# Patient Record
Sex: Female | Born: 1945 | Race: White | Hispanic: No | Marital: Married | Smoking: Current every day smoker
Health system: Southern US, Community
[De-identification: ages and names within clinical notes are randomized; demographics above are authoritative.]

## PROBLEM LIST (undated history)

## (undated) DIAGNOSIS — E119 Type 2 diabetes mellitus without complications: Secondary | ICD-10-CM

## (undated) DIAGNOSIS — M1990 Unspecified osteoarthritis, unspecified site: Secondary | ICD-10-CM

## (undated) DIAGNOSIS — IMO0001 Reserved for inherently not codable concepts without codable children: Secondary | ICD-10-CM

## (undated) DIAGNOSIS — F32A Depression, unspecified: Secondary | ICD-10-CM

## (undated) DIAGNOSIS — I10 Essential (primary) hypertension: Secondary | ICD-10-CM

## (undated) DIAGNOSIS — Z950 Presence of cardiac pacemaker: Secondary | ICD-10-CM

## (undated) DIAGNOSIS — K219 Gastro-esophageal reflux disease without esophagitis: Secondary | ICD-10-CM

## (undated) DIAGNOSIS — J449 Chronic obstructive pulmonary disease, unspecified: Secondary | ICD-10-CM

## (undated) DIAGNOSIS — F329 Major depressive disorder, single episode, unspecified: Secondary | ICD-10-CM

## (undated) HISTORY — PX: CHOLECYSTECTOMY: SHX55

---

## 2002-07-28 ENCOUNTER — Ambulatory Visit (HOSPITAL_COMMUNITY): Admission: RE | Admit: 2002-07-28 | Discharge: 2002-07-28 | Payer: Self-pay | Admitting: Internal Medicine

## 2003-01-17 ENCOUNTER — Inpatient Hospital Stay (HOSPITAL_COMMUNITY): Admission: EM | Admit: 2003-01-17 | Discharge: 2003-01-21 | Payer: Self-pay | Admitting: Psychiatry

## 2003-01-19 ENCOUNTER — Encounter (HOSPITAL_COMMUNITY): Payer: Self-pay | Admitting: Psychiatry

## 2003-07-16 ENCOUNTER — Ambulatory Visit (HOSPITAL_COMMUNITY): Admission: RE | Admit: 2003-07-16 | Discharge: 2003-07-16 | Payer: Self-pay | Admitting: Orthopaedic Surgery

## 2003-07-16 ENCOUNTER — Encounter: Payer: Self-pay | Admitting: Orthopaedic Surgery

## 2005-10-22 HISTORY — PX: OTHER SURGICAL HISTORY: SHX169

## 2006-04-09 ENCOUNTER — Ambulatory Visit: Payer: Self-pay | Admitting: Cardiology

## 2006-06-04 ENCOUNTER — Ambulatory Visit (HOSPITAL_COMMUNITY): Admission: RE | Admit: 2006-06-04 | Discharge: 2006-06-04 | Payer: Self-pay | Admitting: Ophthalmology

## 2006-10-22 HISTORY — PX: EYE SURGERY: SHX253

## 2009-05-11 ENCOUNTER — Encounter: Admission: RE | Admit: 2009-05-11 | Discharge: 2009-05-11 | Payer: Self-pay | Admitting: Otolaryngology

## 2011-08-02 ENCOUNTER — Ambulatory Visit: Payer: Self-pay | Admitting: Physical Therapy

## 2011-08-09 ENCOUNTER — Ambulatory Visit: Payer: Medicare Other | Admitting: Physical Therapy

## 2014-09-17 ENCOUNTER — Emergency Department (HOSPITAL_COMMUNITY): Payer: Medicare Other

## 2014-09-17 ENCOUNTER — Encounter (HOSPITAL_COMMUNITY): Payer: Self-pay | Admitting: Emergency Medicine

## 2014-09-17 ENCOUNTER — Inpatient Hospital Stay (HOSPITAL_COMMUNITY)
Admission: EM | Admit: 2014-09-17 | Discharge: 2014-09-19 | DRG: 190 | Disposition: A | Discharge status: Home / Self Care | Payer: Medicare Other | Attending: Internal Medicine | Admitting: Internal Medicine

## 2014-09-17 DIAGNOSIS — T50995A Adverse effect of other drugs, medicaments and biological substances, initial encounter: Secondary | ICD-10-CM | POA: Diagnosis present

## 2014-09-17 DIAGNOSIS — R7989 Other specified abnormal findings of blood chemistry: Secondary | ICD-10-CM | POA: Diagnosis present

## 2014-09-17 DIAGNOSIS — E785 Hyperlipidemia, unspecified: Secondary | ICD-10-CM

## 2014-09-17 DIAGNOSIS — F419 Anxiety disorder, unspecified: Secondary | ICD-10-CM | POA: Diagnosis present

## 2014-09-17 DIAGNOSIS — E872 Acidosis: Secondary | ICD-10-CM | POA: Diagnosis present

## 2014-09-17 DIAGNOSIS — T402X1A Poisoning by other opioids, accidental (unintentional), initial encounter: Secondary | ICD-10-CM | POA: Diagnosis present

## 2014-09-17 DIAGNOSIS — I248 Other forms of acute ischemic heart disease: Secondary | ICD-10-CM | POA: Diagnosis present

## 2014-09-17 DIAGNOSIS — F17210 Nicotine dependence, cigarettes, uncomplicated: Secondary | ICD-10-CM | POA: Diagnosis present

## 2014-09-17 DIAGNOSIS — I10 Essential (primary) hypertension: Secondary | ICD-10-CM | POA: Diagnosis present

## 2014-09-17 DIAGNOSIS — T402X5A Adverse effect of other opioids, initial encounter: Secondary | ICD-10-CM | POA: Diagnosis present

## 2014-09-17 DIAGNOSIS — T40605A Adverse effect of unspecified narcotics, initial encounter: Secondary | ICD-10-CM | POA: Diagnosis present

## 2014-09-17 DIAGNOSIS — G92 Toxic encephalopathy: Secondary | ICD-10-CM | POA: Diagnosis present

## 2014-09-17 DIAGNOSIS — D72829 Elevated white blood cell count, unspecified: Secondary | ICD-10-CM | POA: Diagnosis present

## 2014-09-17 DIAGNOSIS — J9601 Acute respiratory failure with hypoxia: Secondary | ICD-10-CM | POA: Diagnosis present

## 2014-09-17 DIAGNOSIS — J441 Chronic obstructive pulmonary disease with (acute) exacerbation: Principal | ICD-10-CM | POA: Diagnosis present

## 2014-09-17 DIAGNOSIS — E1169 Type 2 diabetes mellitus with other specified complication: Secondary | ICD-10-CM | POA: Diagnosis present

## 2014-09-17 DIAGNOSIS — R0689 Other abnormalities of breathing: Secondary | ICD-10-CM | POA: Diagnosis present

## 2014-09-17 DIAGNOSIS — E1165 Type 2 diabetes mellitus with hyperglycemia: Secondary | ICD-10-CM | POA: Diagnosis present

## 2014-09-17 DIAGNOSIS — J9602 Acute respiratory failure with hypercapnia: Secondary | ICD-10-CM | POA: Diagnosis present

## 2014-09-17 DIAGNOSIS — R6510 Systemic inflammatory response syndrome (SIRS) of non-infectious origin without acute organ dysfunction: Secondary | ICD-10-CM | POA: Diagnosis present

## 2014-09-17 DIAGNOSIS — T40601A Poisoning by unspecified narcotics, accidental (unintentional), initial encounter: Secondary | ICD-10-CM | POA: Insufficient documentation

## 2014-09-17 DIAGNOSIS — R945 Abnormal results of liver function studies: Secondary | ICD-10-CM | POA: Diagnosis present

## 2014-09-17 DIAGNOSIS — Z888 Allergy status to other drugs, medicaments and biological substances status: Secondary | ICD-10-CM

## 2014-09-17 DIAGNOSIS — R062 Wheezing: Secondary | ICD-10-CM

## 2014-09-17 HISTORY — DX: Chronic obstructive pulmonary disease, unspecified: J44.9

## 2014-09-17 HISTORY — DX: Type 2 diabetes mellitus without complications: E11.9

## 2014-09-17 HISTORY — DX: Unspecified osteoarthritis, unspecified site: M19.90

## 2014-09-17 HISTORY — DX: Depression, unspecified: F32.A

## 2014-09-17 HISTORY — DX: Essential (primary) hypertension: I10

## 2014-09-17 HISTORY — DX: Presence of cardiac pacemaker: Z95.0

## 2014-09-17 HISTORY — DX: Reserved for inherently not codable concepts without codable children: IMO0001

## 2014-09-17 HISTORY — DX: Major depressive disorder, single episode, unspecified: F32.9

## 2014-09-17 HISTORY — DX: Gastro-esophageal reflux disease without esophagitis: K21.9

## 2014-09-17 LAB — URINALYSIS, ROUTINE W REFLEX MICROSCOPIC
Bilirubin Urine: NEGATIVE
KETONES UR: NEGATIVE mg/dL
Leukocytes, UA: NEGATIVE
Nitrite: NEGATIVE
PH: 5.5 (ref 5.0–8.0)
Protein, ur: NEGATIVE mg/dL
Specific Gravity, Urine: 1.018 (ref 1.005–1.030)
Urobilinogen, UA: 0.2 mg/dL (ref 0.0–1.0)

## 2014-09-17 LAB — URINE MICROSCOPIC-ADD ON

## 2014-09-17 LAB — CBC WITH DIFFERENTIAL/PLATELET
BASOS ABS: 0 10*3/uL (ref 0.0–0.1)
BASOS PCT: 0 % (ref 0–1)
Eosinophils Absolute: 0 10*3/uL (ref 0.0–0.7)
Eosinophils Relative: 0 % (ref 0–5)
HCT: 45 % (ref 36.0–46.0)
Hemoglobin: 13.8 g/dL (ref 12.0–15.0)
Lymphocytes Relative: 6 % — ABNORMAL LOW (ref 12–46)
Lymphs Abs: 1.2 10*3/uL (ref 0.7–4.0)
MCH: 29.6 pg (ref 26.0–34.0)
MCHC: 30.7 g/dL (ref 30.0–36.0)
MCV: 96.6 fL (ref 78.0–100.0)
Monocytes Absolute: 0.8 10*3/uL (ref 0.1–1.0)
Monocytes Relative: 4 % (ref 3–12)
NEUTROS ABS: 20.1 10*3/uL — AB (ref 1.7–7.7)
NEUTROS PCT: 90 % — AB (ref 43–77)
PLATELETS: 320 10*3/uL (ref 150–400)
RBC: 4.66 MIL/uL (ref 3.87–5.11)
RDW: 13.8 % (ref 11.5–15.5)
WBC: 22.1 10*3/uL — ABNORMAL HIGH (ref 4.0–10.5)

## 2014-09-17 LAB — COMPREHENSIVE METABOLIC PANEL
ALBUMIN: 4 g/dL (ref 3.5–5.2)
ALT: 47 U/L — ABNORMAL HIGH (ref 0–35)
AST: 60 U/L — AB (ref 0–37)
Alkaline Phosphatase: 148 U/L — ABNORMAL HIGH (ref 39–117)
Anion gap: 20 — ABNORMAL HIGH (ref 5–15)
BUN: 18 mg/dL (ref 6–23)
CHLORIDE: 94 meq/L — AB (ref 96–112)
CO2: 21 mEq/L (ref 19–32)
Calcium: 9.1 mg/dL (ref 8.4–10.5)
Creatinine, Ser: 1.03 mg/dL (ref 0.50–1.10)
GFR calc Af Amer: 64 mL/min — ABNORMAL LOW (ref >90)
GFR calc non Af Amer: 55 mL/min — ABNORMAL LOW (ref >90)
Glucose, Bld: 431 mg/dL — ABNORMAL HIGH (ref 70–99)
POTASSIUM: 4.9 meq/L (ref 3.7–5.3)
SODIUM: 135 meq/L — AB (ref 137–147)
Total Protein: 8 g/dL (ref 6.0–8.3)

## 2014-09-17 LAB — CBG MONITORING, ED
Glucose-Capillary: 493 mg/dL — ABNORMAL HIGH (ref 70–99)
Glucose-Capillary: 229 mg/dL — ABNORMAL HIGH (ref 70–99)

## 2014-09-17 LAB — RAPID URINE DRUG SCREEN, HOSP PERFORMED
AMPHETAMINES: NOT DETECTED
BENZODIAZEPINES: NOT DETECTED
Barbiturates: NOT DETECTED
Cocaine: NOT DETECTED
OPIATES: POSITIVE — AB
Tetrahydrocannabinol: NOT DETECTED

## 2014-09-17 LAB — BLOOD GAS, ARTERIAL
Acid-base deficit: 3.7 mmol/L — ABNORMAL HIGH (ref 0.0–2.0)
BICARBONATE: 24.3 meq/L — AB (ref 20.0–24.0)
Delivery systems: POSITIVE
Drawn by: 331471
Expiratory PAP: 5
FIO2: 0.4 %
Inspiratory PAP: 14
O2 SAT: 96.2 %
PATIENT TEMPERATURE: 98.6
TCO2: 22.3 mmol/L (ref 0–100)
pCO2 arterial: 58.5 mmHg (ref 35.0–45.0)
pH, Arterial: 7.241 — ABNORMAL LOW (ref 7.350–7.450)
pO2, Arterial: 88.3 mmHg (ref 80.0–100.0)

## 2014-09-17 LAB — GLUCOSE, CAPILLARY: GLUCOSE-CAPILLARY: 300 mg/dL — AB (ref 70–99)

## 2014-09-17 LAB — TROPONIN I

## 2014-09-17 MED ORDER — INSULIN ASPART 100 UNIT/ML ~~LOC~~ SOLN
0.0000 [IU] | Freq: Every day | SUBCUTANEOUS | Status: DC
  Administered 2014-09-17 – 2014-09-18 (×2): 3 [IU] via SUBCUTANEOUS

## 2014-09-17 MED ORDER — METHYLPREDNISOLONE SODIUM SUCC 125 MG IJ SOLR
60.0000 mg | Freq: Two times a day (BID) | INTRAMUSCULAR | Status: DC
  Administered 2014-09-17 – 2014-09-19 (×4): 60 mg via INTRAVENOUS
  Filled 2014-09-17: qty 0.96
  Filled 2014-09-17: qty 2
  Filled 2014-09-17 (×2): qty 0.96

## 2014-09-17 MED ORDER — DIPHENHYDRAMINE HCL 25 MG PO CAPS
25.0000 mg | ORAL_CAPSULE | Freq: Once | ORAL | Status: AC
  Administered 2014-09-18: 25 mg via ORAL
  Filled 2014-09-17: qty 1

## 2014-09-17 MED ORDER — PIPERACILLIN-TAZOBACTAM 3.375 G IVPB
3.3750 g | Freq: Three times a day (TID) | INTRAVENOUS | Status: DC
Start: 2014-09-17 — End: 2014-09-19
  Administered 2014-09-17 – 2014-09-19 (×5): 3.375 g via INTRAVENOUS
  Filled 2014-09-17 (×6): qty 50

## 2014-09-17 MED ORDER — INSULIN ASPART 100 UNIT/ML ~~LOC~~ SOLN
0.0000 [IU] | Freq: Three times a day (TID) | SUBCUTANEOUS | Status: DC
  Administered 2014-09-18 (×2): 15 [IU] via SUBCUTANEOUS
  Administered 2014-09-18 – 2014-09-19 (×2): 8 [IU] via SUBCUTANEOUS
  Administered 2014-09-19: 15 [IU] via SUBCUTANEOUS

## 2014-09-17 MED ORDER — IPRATROPIUM-ALBUTEROL 0.5-2.5 (3) MG/3ML IN SOLN
3.0000 mL | Freq: Once | RESPIRATORY_TRACT | Status: AC
  Administered 2014-09-17: 3 mL via RESPIRATORY_TRACT
  Filled 2014-09-17: qty 3

## 2014-09-17 MED ORDER — VANCOMYCIN HCL IN DEXTROSE 1-5 GM/200ML-% IV SOLN
1000.0000 mg | Freq: Once | INTRAVENOUS | Status: AC
  Administered 2014-09-17: 1000 mg via INTRAVENOUS
  Filled 2014-09-17: qty 200

## 2014-09-17 MED ORDER — PIPERACILLIN-TAZOBACTAM 3.375 G IVPB
3.3750 g | Freq: Three times a day (TID) | INTRAVENOUS | Status: DC
  Filled 2014-09-17 (×2): qty 50

## 2014-09-17 MED ORDER — ALBUTEROL SULFATE (2.5 MG/3ML) 0.083% IN NEBU
2.5000 mg | INHALATION_SOLUTION | RESPIRATORY_TRACT | Status: DC | PRN
  Administered 2014-09-18 – 2014-09-19 (×5): 2.5 mg via RESPIRATORY_TRACT
  Filled 2014-09-17 (×5): qty 3

## 2014-09-17 MED ORDER — INSULIN GLARGINE 100 UNIT/ML ~~LOC~~ SOLN
25.0000 [IU] | Freq: Every day | SUBCUTANEOUS | Status: DC
  Administered 2014-09-17: 25 [IU] via SUBCUTANEOUS
  Filled 2014-09-17: qty 0.25

## 2014-09-17 MED ORDER — SODIUM CHLORIDE 0.9 % IV SOLN: 250.0000 mL | INTRAVENOUS | Status: DC | PRN

## 2014-09-17 MED ORDER — INSULIN ASPART 100 UNIT/ML ~~LOC~~ SOLN
10.0000 [IU] | Freq: Once | SUBCUTANEOUS | Status: AC
  Administered 2014-09-17: 10 [IU] via INTRAVENOUS
  Filled 2014-09-17: qty 1

## 2014-09-17 MED ORDER — VANCOMYCIN HCL IN DEXTROSE 750-5 MG/150ML-% IV SOLN
750.0000 mg | Freq: Two times a day (BID) | INTRAVENOUS | Status: DC
  Administered 2014-09-18 – 2014-09-19 (×3): 750 mg via INTRAVENOUS
  Filled 2014-09-17 (×3): qty 150

## 2014-09-17 MED ORDER — METHYLPREDNISOLONE SODIUM SUCC 125 MG IJ SOLR
125.0000 mg | Freq: Once | INTRAMUSCULAR | Status: AC
  Administered 2014-09-17: 125 mg via INTRAVENOUS
  Filled 2014-09-17: qty 2

## 2014-09-17 MED ORDER — ACETAMINOPHEN 325 MG PO TABS
650.0000 mg | ORAL_TABLET | Freq: Once | ORAL | Status: AC
  Administered 2014-09-18: 650 mg via ORAL
  Filled 2014-09-17: qty 2

## 2014-09-17 MED ORDER — NICOTINE 21 MG/24HR TD PT24
21.0000 mg | MEDICATED_PATCH | Freq: Every day | TRANSDERMAL | Status: DC
  Administered 2014-09-18 – 2014-09-19 (×2): 21 mg via TRANSDERMAL
  Filled 2014-09-17 (×2): qty 1

## 2014-09-17 NOTE — Progress Notes (Addendum)
ANTIBIOTIC CONSULT NOTE - INITIAL  Pharmacy Consult for vancomycin Indication: rule out sepsis  Allergies  Allergen Reactions  . Ace Inhibitors Cough  . Nsaids     Leg edema    Patient Measurements: Height: 5\' 2"  (157.5 cm) Weight: 196 lb (88.905 kg) IBW/kg (Calculated) : 50.1 Adjusted Body Weight:   Vital Signs: Temp: 99.1 F (37.3 C) (11/27 2220) Temp Source: Oral (11/27 2220) BP: 105/40 mmHg (11/27 2220) Pulse Rate: 93 (11/27 2220) Intake/Output from previous day:   Intake/Output from this shift:    Labs:  Recent Labs  09/17/14 1441  WBC 22.1*  HGB 13.8  PLT 320  CREATININE 1.03   Estimated Creatinine Clearance: 54.9 mL/min (by C-G formula based on Cr of 1.03). No results for input(s): VANCOTROUGH, VANCOPEAK, VANCORANDOM, GENTTROUGH, GENTPEAK, GENTRANDOM, TOBRATROUGH, TOBRAPEAK, TOBRARND, AMIKACINPEAK, AMIKACINTROU, AMIKACIN in the last 72 hours.   Microbiology: No results found for this or any previous visit (from the past 720 hour(s)).  Medical History: Past Medical History  Diagnosis Date  . Diabetes mellitus without complication   . Hypertension   . Presence of permanent cardiac pacemaker   . COPD (chronic obstructive pulmonary disease)   . Shortness of breath dyspnea   . Arthritis     degenerative bone disease  . GERD (gastroesophageal reflux disease)   . Depression    Assessment: 27 YOF brought to ED due to unresponsiveness d/t accidental opioid OD. She has COPD and starting broad spectrum antibiotics for AECOPD. No pneumonia per CXR at admission. Vancomycin ordered per pharmacy and zosyn per MD.   11/27 >> Vanc >> 11/27 >> Zosyn >>   Tmax: afeb WBCs: elevated (steroids) Renal: SCr WNL for normalized CrCl = 62ml/min  11/27 blood:  Goal of Therapy:  Vancomycin trough level 15-20 mcg/ml  Plan:   Vancomycin - awaiting height and weight to dose  Check steady state level  Follow renal function  Continue zosyn 3.375gm IV q8h over  4h infusion as ordered  Doreene Eland, PharmD, BCPS.   Pager: [phone]  09/17/2014,11:08 PM   Addendum: Start Vanc with 1g x 1 then 750mg  IV q12h.  Romeo Rabon, PharmD, pager [phone]. 09/17/2014,11:08 PM.

## 2014-09-17 NOTE — Progress Notes (Signed)
Pt is awake, alert, oriented, and mentating well.  Pt is stable and comfortable.  Pt has no notable WOB or SOB.  No BiPAP needed at this time.  Rt will monitor and assess as needed.

## 2014-09-17 NOTE — ED Notes (Addendum)
Per EMS-pt from home with c/o hyperglycemia. Last seen this morning around 7. Pt was unresponsive. Given narcan. Now responding to verbal stimuli. CBG 528. Sinus rhythm. Has nasophargeal airway in place. Prescribed Percocet.
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
## 2014-09-17 NOTE — ED Notes (Signed)
Attempted to call floor for report , room assignment pending at this time. Charge Nurse aware.

## 2014-09-17 NOTE — ED Notes (Signed)
Bed: W[facility]61 Expected date:  Expected time:  Means of arrival:  Comments: 68 y/o F, unresponsive ^CBG, narcan, more awake now

## 2014-09-17 NOTE — ED Provider Notes (Signed)
CSN: [id number]     Arrival date & time 09/17/14  1314 History   First MD Initiated Contact with Patient 09/17/14 1354     Chief Complaint  Patient presents with  . Hyperglycemia     (Consider location/radiation/quality/duration/timing/severity/associated sxs/prior Treatment) Patient is a 68 y.o. female presenting with hyperglycemia. The history is provided by the EMS personnel, the patient and a relative.  Hyperglycemia She was brought into the ED because of being unresponsive. She was seen by family at about 7 AM. Patient states that she had not unable to get to sleep until after 6 AM and she had taken oxycodone-acetaminophen for pain and hydrocodone for cough. EMS arrived and found the patient unresponsive. She was treated with naloxone with significant improvement. Family states that she is much more awake but not quite to her baseline. CBC was noted to be 528. Per family, patient is a heavy smoker and is not on any oxygen at home.  Past Medical History  Diagnosis Date  . Diabetes mellitus without complication    History reviewed. No pertinent past surgical history. No family history on file. History  Substance Use Topics  . Smoking status: Not on file  . Smokeless tobacco: Not on file  . Alcohol Use: Not on file   OB History    No data available     Review of Systems  All other systems reviewed and are negative.     Allergies  Review of patient's allergies indicates not on file.  Home Medications   Prior to Admission medications   Not on File   BP 90/72 mmHg  Pulse 105  Temp(Src) 98.5 F (36.9 C) (Oral)  Resp 18  SpO2 98% Physical Exam  Nursing note and vitals reviewed.  68 year old female, resting comfortably and in no acute distress. Vital signs are significant for borderline hypertension and mild tachycardia. Oxygen saturation is 98%, which is normal. She is on a nonrebreather oxygen mask and has a nasal trumpet in place. Head is normocephalic and  atraumatic. PERRLA, EOMI. Oropharynx is clear. Neck is nontender and supple without adenopathy or JVD. Back is nontender and there is no CVA tenderness. Lungs have diffuse expiratory wheezes. Chest is nontender. Heart has regular rate and rhythm without murmur. Abdomen is soft, flat, nontender without masses or hepatosplenomegaly and peristalsis is normoactive. Extremities have 2+ edema, full range of motion is present. Skin is warm and dry without rash. Neurologic: She is sleepy but easily arousable and oriented when aroused, cranial nerves are intact, there are no motor or sensory deficits.  ED Course  Procedures (including critical care time) Labs Review Labs Reviewed  CBG MONITORING, ED - Abnormal; Notable for the following:    Glucose-Capillary 493 (*)    All other components within normal limits  CBG MONITORING, ED    Imaging Review Dg Chest Port 1 View  09/17/2014   CLINICAL DATA:  Unresponsive this morning when family tried to wake her, cough and shortness of breath for 1 day, history smoking, COPD, diabetes  EXAM: PORTABLE CHEST - 1 VIEW  COMPARISON:  Portable exam 1432 hr compared to 11/08/2009  FINDINGS: Minimal enlargement of cardiac silhouette.  Mediastinal contours and pulmonary vascularity grossly normal for technique.  Lungs clear.  No pleural effusion or pneumothorax.  Bones unremarkable.  IMPRESSION: Minimal enlargement of cardiac silhouette.  No acute abnormalities.   Electronically Signed   By: Lavonia Dana M.D.   On: 09/17/2014 14:48     EKG Interpretation  Date/Time:  Friday September 17 2014 15:32:59 EST Ventricular Rate:  96 PR Interval:  166 QRS Duration: 90 QT Interval:  396 QTC Calculation: 500 R Axis:   59 Text Interpretation:  Sinus rhythm Low voltage, precordial leads  Borderline T abnormalities, anterior leads Prolonged QT interval No old  tracing to compare Confirmed by [facility]  MD, Ercil Cassis ([id number]) on 09/17/2014  4:00:37 PM     CRITICAL  CARE Performed by: FIEPP,IRJJO Total critical care time: 95 minutes Critical care time was exclusive of separately billable procedures and treating other patients. Critical care was necessary to treat or prevent imminent or life-threatening deterioration. Critical care was time spent personally by me on the following activities: development of treatment plan with patient and/or surrogate as well as nursing, discussions with consultants, evaluation of patient's response to treatment, examination of patient, obtaining history from patient or surrogate, ordering and performing treatments and interventions, ordering and review of laboratory studies, ordering and review of radiographic studies, pulse oximetry and re-evaluation of patient's condition.  MDM   Final diagnoses:  Wheezing  Acute respiratory failure with hypoxia and hypercarbia  Narcotic overdose, accidental or unintentional, initial encounter    Altered mental status which is likely due to accidental narcotic overdose. However, she clearly has COPD as evidenced by her body habitus and wheezing. ABG will be obtained to evaluate possible hypercarbia has a component of her somnolence. Elevated CBG will be treated with insulin. She clearly is fluid overloaded now, so she will not get additional fluid. She has no recent records and the [facility] system. She is given albuterol with ipratropium for her wheezing.  ABG has come back showing evidence of acute respiratory failure with acidosis. She is awake and alert and conversant. At this point, it seems that her somnolence was probably multifactorial with both accidental narcotic overdose and hypercarbia. Hypercarbia was also worsened by being on 100% oxygen. She has considerably reduced wheezing after nebulizer treatment. She is placed on BiPAP to try to reverse her hypercarbia. She clearly will need steroids because of her COPD exacerbation with hypercarbia. Because she is already hyperglycemic  to begin with, she will definitely need hospitalization while her blood sugars are monitored closely. Family is present during discussion of all of these items.  Repeat ABG is pending. Repeat CBG is pending. She will clearly need to be admitted. Whether she goes to ICU or stepdown will be dependent on how well her hypercarbia has improved with BiPAP. Case is signed out to Dr. Wilson Singer.  Delora Fuel, MD 84/16/60 [id number]

## 2014-09-18 ENCOUNTER — Inpatient Hospital Stay (HOSPITAL_COMMUNITY): Payer: Medicare Other

## 2014-09-18 DIAGNOSIS — I517 Cardiomegaly: Secondary | ICD-10-CM

## 2014-09-18 LAB — GLUCOSE, CAPILLARY
GLUCOSE-CAPILLARY: 270 mg/dL — AB (ref 70–99)
Glucose-Capillary: 287 mg/dL — ABNORMAL HIGH (ref 70–99)
Glucose-Capillary: 352 mg/dL — ABNORMAL HIGH (ref 70–99)
Glucose-Capillary: 377 mg/dL — ABNORMAL HIGH (ref 70–99)

## 2014-09-18 LAB — COMPREHENSIVE METABOLIC PANEL
ALT: 39 U/L — AB (ref 0–35)
AST: 40 U/L — AB (ref 0–37)
Albumin: 3.7 g/dL (ref 3.5–5.2)
Alkaline Phosphatase: 117 U/L (ref 39–117)
Anion gap: 18 — ABNORMAL HIGH (ref 5–15)
BUN: 22 mg/dL (ref 6–23)
CALCIUM: 9.3 mg/dL (ref 8.4–10.5)
CO2: 23 meq/L (ref 19–32)
CREATININE: 0.92 mg/dL (ref 0.50–1.10)
Chloride: 94 mEq/L — ABNORMAL LOW (ref 96–112)
GFR calc Af Amer: 73 mL/min — ABNORMAL LOW (ref >90)
GFR, EST NON AFRICAN AMERICAN: 63 mL/min — AB (ref >90)
Glucose, Bld: 323 mg/dL — ABNORMAL HIGH (ref 70–99)
Potassium: 4.2 mEq/L (ref 3.7–5.3)
Sodium: 135 mEq/L — ABNORMAL LOW (ref 137–147)
TOTAL PROTEIN: 7.3 g/dL (ref 6.0–8.3)
Total Bilirubin: 0.2 mg/dL — ABNORMAL LOW (ref 0.3–1.2)

## 2014-09-18 LAB — CBC
HCT: 38.2 % (ref 36.0–46.0)
HEMOGLOBIN: 12.3 g/dL (ref 12.0–15.0)
MCH: 30 pg (ref 26.0–34.0)
MCHC: 32.2 g/dL (ref 30.0–36.0)
MCV: 93.2 fL (ref 78.0–100.0)
PLATELETS: 307 10*3/uL (ref 150–400)
RBC: 4.1 MIL/uL (ref 3.87–5.11)
RDW: 13.6 % (ref 11.5–15.5)
WBC: 14.7 10*3/uL — AB (ref 4.0–10.5)

## 2014-09-18 LAB — PHOSPHORUS: PHOSPHORUS: 3 mg/dL (ref 2.3–4.6)

## 2014-09-18 LAB — HEPATITIS PANEL, ACUTE
HCV Ab: NEGATIVE
HEP A IGM: NONREACTIVE
HEP B C IGM: NONREACTIVE
Hepatitis B Surface Ag: NEGATIVE

## 2014-09-18 LAB — TROPONIN I
Troponin I: 0.32 ng/mL (ref <0.30)
Troponin I: 0.37 ng/mL (ref <0.30)

## 2014-09-18 LAB — INFLUENZA PANEL BY PCR (TYPE A & B)
H1N1 flu by pcr: NOT DETECTED
Influenza A By PCR: NEGATIVE
Influenza B By PCR: NEGATIVE

## 2014-09-18 LAB — HEMOGLOBIN A1C
HEMOGLOBIN A1C: 7.9 % — AB (ref <5.7)
MEAN PLASMA GLUCOSE: 180 mg/dL — AB (ref <117)

## 2014-09-18 LAB — MAGNESIUM: Magnesium: 1.8 mg/dL (ref 1.5–2.5)

## 2014-09-18 LAB — TSH: TSH: 0.787 u[IU]/mL (ref 0.350–4.500)

## 2014-09-18 MED ORDER — CETYLPYRIDINIUM CHLORIDE 0.05 % MT LIQD
7.0000 mL | Freq: Two times a day (BID) | OROMUCOSAL | Status: DC
  Administered 2014-09-18 – 2014-09-19 (×3): 7 mL via OROMUCOSAL

## 2014-09-18 MED ORDER — BUDESONIDE-FORMOTEROL FUMARATE 80-4.5 MCG/ACT IN AERO
2.0000 | INHALATION_SPRAY | Freq: Two times a day (BID) | RESPIRATORY_TRACT | Status: DC
  Administered 2014-09-18 – 2014-09-19 (×3): 2 via RESPIRATORY_TRACT
  Filled 2014-09-18: qty 6.9

## 2014-09-18 MED ORDER — PANTOPRAZOLE SODIUM 40 MG PO TBEC
40.0000 mg | DELAYED_RELEASE_TABLET | Freq: Every day | ORAL | Status: DC
  Administered 2014-09-18 – 2014-09-19 (×2): 40 mg via ORAL
  Filled 2014-09-18 (×2): qty 1

## 2014-09-18 MED ORDER — METOPROLOL TARTRATE 25 MG PO TABS
12.5000 mg | ORAL_TABLET | Freq: Two times a day (BID) | ORAL | Status: DC
  Administered 2014-09-18 – 2014-09-19 (×3): 12.5 mg via ORAL
  Filled 2014-09-18 (×3): qty 1

## 2014-09-18 MED ORDER — OXYCODONE-ACETAMINOPHEN 5-325 MG PO TABS
1.0000 | ORAL_TABLET | Freq: Four times a day (QID) | ORAL | Status: DC | PRN
Start: 2014-09-18 — End: 2014-09-19
  Administered 2014-09-18 – 2014-09-19 (×3): 1 via ORAL
  Filled 2014-09-18 (×3): qty 1

## 2014-09-18 MED ORDER — ACETAMINOPHEN 325 MG PO TABS: 650.0000 mg | ORAL_TABLET | Freq: Four times a day (QID) | ORAL | Status: DC | PRN

## 2014-09-18 MED ORDER — ACETAMINOPHEN 650 MG RE SUPP: 650.0000 mg | Freq: Four times a day (QID) | RECTAL | Status: DC | PRN

## 2014-09-18 MED ORDER — SODIUM CHLORIDE 0.9 % IJ SOLN
3.0000 mL | Freq: Two times a day (BID) | INTRAMUSCULAR | Status: DC
  Administered 2014-09-18 (×2): 3 mL via INTRAVENOUS

## 2014-09-18 MED ORDER — ENOXAPARIN SODIUM 40 MG/0.4ML ~~LOC~~ SOLN
40.0000 mg | Freq: Every day | SUBCUTANEOUS | Status: DC
  Administered 2014-09-18 (×2): 40 mg via SUBCUTANEOUS
  Filled 2014-09-18 (×3): qty 0.4

## 2014-09-18 MED ORDER — GUAIFENESIN-DM 100-10 MG/5ML PO SYRP
5.0000 mL | ORAL_SOLUTION | ORAL | Status: DC | PRN
  Administered 2014-09-18: 5 mL via ORAL
  Filled 2014-09-18: qty 10

## 2014-09-18 MED ORDER — GUAIFENESIN ER 600 MG PO TB12
600.0000 mg | ORAL_TABLET | Freq: Two times a day (BID) | ORAL | Status: DC
Start: 2014-09-18 — End: 2014-09-18
  Administered 2014-09-18 (×2): 600 mg via ORAL
  Filled 2014-09-18 (×6): qty 1

## 2014-09-18 MED ORDER — DOCUSATE SODIUM 100 MG PO CAPS
100.0000 mg | ORAL_CAPSULE | Freq: Two times a day (BID) | ORAL | Status: DC
  Administered 2014-09-18: 100 mg via ORAL
  Filled 2014-09-18 (×4): qty 1

## 2014-09-18 MED ORDER — LORAZEPAM 0.5 MG PO TABS: 0.5000 mg | ORAL_TABLET | Freq: Four times a day (QID) | ORAL | Status: DC | PRN

## 2014-09-18 MED ORDER — IPRATROPIUM BROMIDE 0.02 % IN SOLN
0.5000 mg | Freq: Four times a day (QID) | RESPIRATORY_TRACT | Status: DC
  Administered 2014-09-18 – 2014-09-19 (×6): 0.5 mg via RESPIRATORY_TRACT
  Filled 2014-09-18 (×6): qty 2.5

## 2014-09-18 MED ORDER — INSULIN GLARGINE 100 UNIT/ML ~~LOC~~ SOLN
10.0000 [IU] | Freq: Once | SUBCUTANEOUS | Status: AC
Start: 2014-09-18 — End: 2014-09-18
  Administered 2014-09-18: 10 [IU] via SUBCUTANEOUS
  Filled 2014-09-18: qty 0.1

## 2014-09-18 MED ORDER — SODIUM CHLORIDE 0.9 % IJ SOLN: 3.0000 mL | INTRAMUSCULAR | Status: DC | PRN

## 2014-09-18 MED ORDER — FAMOTIDINE 20 MG PO TABS
20.0000 mg | ORAL_TABLET | Freq: Every day | ORAL | Status: DC
  Administered 2014-09-18 (×2): 20 mg via ORAL
  Filled 2014-09-18 (×4): qty 1

## 2014-09-18 MED ORDER — CITALOPRAM HYDROBROMIDE 20 MG PO TABS
20.0000 mg | ORAL_TABLET | Freq: Every day | ORAL | Status: DC
  Administered 2014-09-18 – 2014-09-19 (×2): 20 mg via ORAL
  Filled 2014-09-18 (×2): qty 1

## 2014-09-18 MED ORDER — ASPIRIN EC 81 MG PO TBEC
81.0000 mg | DELAYED_RELEASE_TABLET | Freq: Every day | ORAL | Status: DC
  Administered 2014-09-18 – 2014-09-19 (×2): 81 mg via ORAL
  Filled 2014-09-18 (×2): qty 1

## 2014-09-18 MED ORDER — SODIUM CHLORIDE 0.9 % IJ SOLN
3.0000 mL | Freq: Two times a day (BID) | INTRAMUSCULAR | Status: DC
  Administered 2014-09-19: 3 mL via INTRAVENOUS

## 2014-09-18 MED ORDER — INSULIN GLARGINE 100 UNIT/ML ~~LOC~~ SOLN
35.0000 [IU] | Freq: Every day | SUBCUTANEOUS | Status: DC
  Administered 2014-09-18: 35 [IU] via SUBCUTANEOUS
  Filled 2014-09-18: qty 0.35

## 2014-09-18 NOTE — Progress Notes (Signed)
  Echocardiogram 2D Echocardiogram has been performed.  Lysle Rubens 09/18/2014, 12:29 PM

## 2014-09-18 NOTE — Progress Notes (Signed)
CRITICAL VALUE ALERT  Critical value received:  Trop 0.32  Date of notification:  09/18/14  Time of notification:  0300  Critical value read back:Yes.    Nurse who received alert:  Hansel Feinstein RN  MD notified (1st page):  Kathline Magic NP  Time of first page:  0310  MD notified (2nd page):  Time of second page:  Responding MD:  Kathline Magic NP  Time MD responded:  [phone]

## 2014-09-18 NOTE — Progress Notes (Addendum)
[facility] PROGRESS NOTE  Maureen Hunt URK:[id number] DOB: 12/24/1945 DOA: 09/17/2014 PCP: Maureen Lair, MD  Assessment/Plan: Active Problems:   COPD exacerbation   Hypercarbia   DM type 2 with diabetic dyslipidemia   Opiates and related narcotics causing adverse effect   Leukocytosis   Elevated LFTs   SIRS (systemic inflammatory response syndrome)     Present on Admission:  . COPD exacerbation - - continue IV steroids, empiric, antibiotics, Albuterol PRN, scheduled Atrovent, Dulera and Mucinex. Titrate O2 to saturation >90%. Follow patients respiratory status. BiPAP prn, improve somnolent most likely will not be BiPAP overnight, no indication for repeat ABG, influenza PCR negative   Demand ischemia, abnormal troponin however chest pain-free Will start the patient on aspirin and low-dose beta blocker, 2-D echo pending  Toxic Encephalopathy -secondary to polypharmacy , patient on Celexa, gabapentin, lorazepam, Remeron, Percocet, this is improving  . Hypercarbia - patient is currently alert avoid over sedation medications . DM type 2 with diabetic dyslipidemia -increase lantus to 35 units, moderate SSI . Opiates and related narcotics causing adverse effect - hold percocet and oipid containing medications . Leukocytosis - eval for sepsis/SIRS given night sweats chills, hypotension and taachycardia, blood cultures,  . Elevated LFTs - hold statins, obtain hepatitis panel, RUQ US shows nonspecific hepatomegaly SIRS -improving, continue IVF, blood and urine culture, IV antibiotics Alcohol Use - reports no hx of abuse, this was occasional use,  Tobacco abuse not interested in quiting   Code Status: full Family Communication: family updated about patient's clinical progress Disposition Plan:  As above     Brief narrative: Maureen Hunt is a 68 y.o. female   has a past medical history of Diabetes mellitus without complication and Hypertension.   Presented with   Patient have been coughing for the past 2 weeks. Denies coughing up anything. No fever but some chills and night sweats. Patient continues to smoke 1-2 per day. Patient was given some cough syrup from her son's girlfriend that had hydrocodone in it. She was using cognac and honey to help with coughing until she run out.  Patient also takes daily Ativan for anxiety. Patient started to become somnolent. Her Son was unable to wake her up. EMS was called on arrival to ER she had an ABG 7.211/57.9/77 she was placed on BiPAP and now improved able to have a conversation on [address]. She is not on oxygen at home.   Hospitalist was called for admission for hypercarbic acidosis and somnolence  Consultants:  None  Procedures:  2-D echo  Antibiotics:  Zosyn and vancomycin  HPI/Subjective: Awake alert oriented Denies any chest pain or shortness of breath, admits to smoking 2 packs a day and smoking throughout the night  Objective: Filed Vitals:   09/18/14 0134 09/18/14 0141 09/18/14 0526 09/18/14 0834  BP:  123/57 130/66   Pulse:  94 81   Temp:  98.5 F (36.9 C) 98 F (36.7 C)   TempSrc:  Oral Oral   Resp:  20 20   Height:      Weight:      SpO2: 97% 94% 97% 95%    Intake/Output Summary (Last 24 hours) at 09/18/14 1111 Last data filed at 09/18/14 0838  Gross per 24 hour  Intake    580 ml  Output    100 ml  Net    480 ml    Exam:  General: alert & oriented x 3 In NAD  Cardiovascular: RRR, nl S1 s2  Respiratory:  Decreased breath sounds at the bases, scattered rhonchi, no crackles  Abdomen: soft +BS NT/ND, no masses palpable  Extremities: No cyanosis and no edema      Data Reviewed: Basic Metabolic Panel:  Recent Labs Lab 09/17/14 1441 09/18/14 0150  NA 135* 135*  K 4.9 4.2  CL 94* 94*  CO2 21 23  GLUCOSE 431* 323*  BUN 18 22  CREATININE 1.03 0.92  CALCIUM 9.1 9.3  MG  --  1.8  PHOS  --  3.0    Liver Function Tests:  Recent Labs Lab 09/17/14 1441  09/18/14 0150  AST 60* 40*  ALT 47* 39*  ALKPHOS 148* 117  BILITOT <0.2* 0.2*  PROT 8.0 7.3  ALBUMIN 4.0 3.7   No results for input(s): LIPASE, AMYLASE in the last 168 hours. No results for input(s): AMMONIA in the last 168 hours.  CBC:  Recent Labs Lab 09/17/14 1441 09/18/14 0150  WBC 22.1* 14.7*  NEUTROABS 20.1*  --   HGB 13.8 12.3  HCT 45.0 38.2  MCV 96.6 93.2  PLT 320 307    Cardiac Enzymes:  Recent Labs Lab 09/17/14 1952 09/18/14 0150 09/18/14 0718  TROPONINI <0.30 0.32* 0.37*   BNP (last 3 results) No results for input(s): PROBNP in the last 8760 hours.   CBG:  Recent Labs Lab 09/17/14 1324 09/17/14 1636 09/17/14 2249 09/18/14 0739  GLUCAP 493* 229* 300* 287*    No results found for this or any previous visit (from the past 240 hour(s)).   Studies: Dg Chest Port 1 View  09/17/2014   CLINICAL DATA:  Unresponsive this morning when family tried to wake her, cough and shortness of breath for 1 day, history smoking, COPD, diabetes  EXAM: PORTABLE CHEST - 1 VIEW  COMPARISON:  Portable exam 1432 hr compared to 11/08/2009  FINDINGS: Minimal enlargement of cardiac silhouette.  Mediastinal contours and pulmonary vascularity grossly normal for technique.  Lungs clear.  No pleural effusion or pneumothorax.  Bones unremarkable.  IMPRESSION: Minimal enlargement of cardiac silhouette.  No acute abnormalities.   Electronically Signed   By: Maureen Hunt M.D.   On: 09/17/2014 14:48   US Abdomen Limited Ruq  09/18/2014   CLINICAL DATA:  Initial evaluation for increased liver function enzymes  EXAM: US ABDOMEN LIMITED - RIGHT UPPER QUADRANT  COMPARISON:  05/26/2008  FINDINGS: Gallbladder:  Surgically absent  Common bile duct:  Diameter: 4 mm  Liver:  Liver shows no focal abnormalities. The liver it is however enlarged to a span of 21 cm. There is diffusely heterogeneous/echogenic hepatic echotexture.  IMPRESSION: Nonspecific findings indicating hepatomegaly and hepatic  parenchymal disease. This appearance is very similar to the prior study.   Electronically Signed   By: Maureen Hunt M.D.   On: 09/18/2014 08:34    Scheduled Meds: . antiseptic oral rinse  7 mL Mouth Rinse BID  . budesonide-formoterol  2 puff Inhalation BID  . citalopram  20 mg Oral Daily  . docusate sodium  100 mg Oral BID  . enoxaparin (LOVENOX) injection  40 mg Subcutaneous QHS  . famotidine  20 mg Oral QHS  . guaiFENesin  600 mg Oral BID  . insulin aspart  0-15 Units Subcutaneous TID WC  . insulin aspart  0-5 Units Subcutaneous QHS  . insulin glargine  25 Units Subcutaneous Q2200  . ipratropium  0.5 mg Nebulization Q6H  . methylPREDNISolone (SOLU-MEDROL) injection  60 mg Intravenous Q12H  . nicotine  21 mg Transdermal Daily  . pantoprazole  40 mg Oral Daily  . piperacillin-tazobactam (ZOSYN)  IV  3.375 g Intravenous 3 times per day  . sodium chloride  3 mL Intravenous Q12H  . sodium chloride  3 mL Intravenous Q12H  . vancomycin  750 mg Intravenous Q12H   Continuous Infusions:   Active Problems:   COPD exacerbation   Hypercarbia   DM type 2 with diabetic dyslipidemia   Opiates and related narcotics causing adverse effect   Leukocytosis   Elevated LFTs   SIRS (systemic inflammatory response syndrome)    Time spent: 40 minutes   [facility] Pager [phone]. If 7PM-7AM, please contact night-coverage at [email or web address], password [facility] 09/18/2014, 11:11 AM  LOS: 1 day

## 2014-09-18 NOTE — Progress Notes (Signed)
RN was notified that patient is coughing. PCP on call was notified.

## 2014-09-18 NOTE — Plan of Care (Signed)
Problem: Phase I Progression Outcomes Goal: Voiding-avoid urinary catheter unless indicated Outcome: Completed/Met Date Met:  09/18/14     

## 2014-09-19 LAB — CBC
HEMATOCRIT: 35 % — AB (ref 36.0–46.0)
Hemoglobin: 11.5 g/dL — ABNORMAL LOW (ref 12.0–15.0)
MCH: 30.1 pg (ref 26.0–34.0)
MCHC: 32.9 g/dL (ref 30.0–36.0)
MCV: 91.6 fL (ref 78.0–100.0)
PLATELETS: 304 10*3/uL (ref 150–400)
RBC: 3.82 MIL/uL — ABNORMAL LOW (ref 3.87–5.11)
RDW: 13.9 % (ref 11.5–15.5)
WBC: 15.1 10*3/uL — AB (ref 4.0–10.5)

## 2014-09-19 LAB — COMPREHENSIVE METABOLIC PANEL
ALBUMIN: 3.6 g/dL (ref 3.5–5.2)
ALK PHOS: 102 U/L (ref 39–117)
ALT: 36 U/L — ABNORMAL HIGH (ref 0–35)
AST: 28 U/L (ref 0–37)
Anion gap: 15 (ref 5–15)
BILIRUBIN TOTAL: 0.4 mg/dL (ref 0.3–1.2)
BUN: 22 mg/dL (ref 6–23)
CHLORIDE: 101 meq/L (ref 96–112)
CO2: 24 meq/L (ref 19–32)
Calcium: 9.5 mg/dL (ref 8.4–10.5)
Creatinine, Ser: 0.76 mg/dL (ref 0.50–1.10)
GFR calc Af Amer: >90 mL/min (ref >90)
GFR calc non Af Amer: 85 mL/min — ABNORMAL LOW (ref >90)
Glucose, Bld: 281 mg/dL — ABNORMAL HIGH (ref 70–99)
Potassium: 4 mEq/L (ref 3.7–5.3)
Sodium: 140 mEq/L (ref 137–147)
Total Protein: 6.9 g/dL (ref 6.0–8.3)

## 2014-09-19 LAB — GLUCOSE, CAPILLARY
Glucose-Capillary: 276 mg/dL — ABNORMAL HIGH (ref 70–99)
Glucose-Capillary: 368 mg/dL — ABNORMAL HIGH (ref 70–99)

## 2014-09-19 LAB — TROPONIN I: Troponin I: <0.3 ng/mL (ref <0.30)

## 2014-09-19 MED ORDER — NICOTINE 21 MG/24HR TD PT24: 21.0000 mg | MEDICATED_PATCH | Freq: Every day | TRANSDERMAL | Status: AC

## 2014-09-19 MED ORDER — INSULIN GLARGINE 100 UNIT/ML ~~LOC~~ SOLN: 35.0000 [IU] | Freq: Every day | SUBCUTANEOUS | Status: AC

## 2014-09-19 MED ORDER — GUAIFENESIN-DM 100-10 MG/5ML PO SYRP: 5.0000 mL | ORAL_SOLUTION | ORAL | Status: DC | PRN

## 2014-09-19 MED ORDER — PREDNISONE 10 MG PO TABS: ORAL_TABLET | ORAL | Status: DC

## 2014-09-19 MED ORDER — ASPIRIN 81 MG PO TBEC: 81.0000 mg | DELAYED_RELEASE_TABLET | Freq: Every day | ORAL | Status: AC

## 2014-09-19 MED ORDER — LEVOFLOXACIN 500 MG PO TABS: 500.0000 mg | ORAL_TABLET | Freq: Every day | ORAL | Status: DC

## 2014-09-19 MED ORDER — LEVALBUTEROL TARTRATE 45 MCG/ACT IN AERO: 2.0000 | INHALATION_SPRAY | Freq: Four times a day (QID) | RESPIRATORY_TRACT | Status: AC | PRN

## 2014-09-19 MED ORDER — METOPROLOL SUCCINATE ER 25 MG PO TB24: 25.0000 mg | ORAL_TABLET | Freq: Every day | ORAL | Status: AC

## 2014-09-19 MED ORDER — LORAZEPAM 1 MG PO TABS: 0.5000 mg | ORAL_TABLET | Freq: Three times a day (TID) | ORAL | Status: AC | PRN

## 2014-09-19 NOTE — Plan of Care (Signed)
Problem: Phase I Progression Outcomes Goal: Hemodynamically stable Outcome: Completed/Met Date Met:  09/19/14

## 2014-09-19 NOTE — Discharge Summary (Addendum)
Physician Discharge Summary  Maureen Hunt MRN: [id number] DOB/AGE: Dec 22, 1945 68 y.o.  PCP: Deloria Lair, MD   Admit date: 09/17/2014 Discharge date: 09/19/2014  Discharge Diagnoses:  :   COPD exacerbation   Hypercarbia   DM type 2 with diabetic dyslipidemia   Opiates and related narcotics causing adverse effect   Leukocytosis   Elevated LFTs   SIRS (systemic inflammatory response syndrome) Demand ischemia    Follow-up recommendations Follow-up with PCP in 5-7 days PCP to arrange for outpatient stress test and cardiology evaluation    Medication List    STOP taking these medications        LANTUS SOLOSTAR 100 UNIT/ML Solostar Pen  Generic drug:  Insulin Glargine  Replaced by:  insulin glargine 100 UNIT/ML injection     oxyCODONE-acetaminophen 5-325 MG per tablet  Commonly known as:  PERCOCET/ROXICET      TAKE these medications        aspirin 81 MG EC tablet  Take 1 tablet (81 mg total) by mouth daily.     budesonide-formoterol 80-4.5 MCG/ACT inhaler  Commonly known as:  SYMBICORT  Inhale 2 puffs into the lungs 2 (two) times daily.     citalopram 20 MG tablet  Commonly known as:  CELEXA  Take 20 mg by mouth daily.     gabapentin 300 MG capsule  Commonly known as:  NEURONTIN  Take 600-900 mg by mouth 3 (three) times daily. 600 IN THE AM AND AFTERNOON AND 900 MG AT BEDTIME     guaiFENesin-dextromethorphan 100-10 MG/5ML syrup  Commonly known as:  ROBITUSSIN DM  Take 5 mLs by mouth every 4 (four) hours as needed for cough.     insulin glargine 100 UNIT/ML injection  Commonly known as:  LANTUS  Inject 0.35 mLs (35 Units total) into the skin daily at 10 pm.     levalbuterol 45 MCG/ACT inhaler  Commonly known as:  XOPENEX HFA  Inhale 2 puffs into the lungs every 6 (six) hours as needed for wheezing.     levocetirizine 5 MG tablet  Commonly known as:  XYZAL  Take 5 mg by mouth every evening.     levofloxacin 500 MG tablet  Commonly known as:   LEVAQUIN  Take 1 tablet (500 mg total) by mouth daily.     LORazepam 1 MG tablet  Commonly known as:  ATIVAN  Take 0.5 tablets (0.5 mg total) by mouth every 8 (eight) hours as needed for anxiety or sleep. Anxiety or sleep     mirtazapine 30 MG tablet  Commonly known as:  REMERON  Take 30 mg by mouth at bedtime.     nicotine 21 mg/24hr patch  Commonly known as:  NICODERM CQ - dosed in mg/24 hours  Place 1 patch (21 mg total) onto the skin daily.     omeprazole 20 MG capsule  Commonly known as:  PRILOSEC  Take 20 mg by mouth 2 (two) times daily.     predniSONE 10 MG tablet  Commonly known as:  DELTASONE  - 5 tablets for 4 days  - 4 tablets for 4 days  - 3 tablets for 4 days  - 2 tablets 4 days  - 1 tablet for 4 days     ranitidine 150 MG tablet  Commonly known as:  ZANTAC  Take 300 mg by mouth daily after supper.     simvastatin 40 MG tablet  Commonly known as:  ZOCOR  Take 40 mg by mouth at  bedtime.     triamterene-hydrochlorothiazide 37.5-25 MG per tablet  Commonly known as:  MAXZIDE-25  Take 1 tablet by mouth daily.        Discharge Condition: Stable   Disposition:    Consults: None Significant Diagnostic Studies: Dg Chest Port 1 View  09/17/2014   CLINICAL DATA:  Unresponsive this morning when family tried to wake her, cough and shortness of breath for 1 day, history smoking, COPD, diabetes  EXAM: PORTABLE CHEST - 1 VIEW  COMPARISON:  Portable exam 1432 hr compared to 11/08/2009  FINDINGS: Minimal enlargement of cardiac silhouette.  Mediastinal contours and pulmonary vascularity grossly normal for technique.  Lungs clear.  No pleural effusion or pneumothorax.  Bones unremarkable.  IMPRESSION: Minimal enlargement of cardiac silhouette.  No acute abnormalities.   Electronically Signed   By: Lavonia Dana M.D.   On: 09/17/2014 14:48   US Abdomen Limited Ruq  09/18/2014   CLINICAL DATA:  Initial evaluation for increased liver function enzymes  EXAM: US ABDOMEN  LIMITED - RIGHT UPPER QUADRANT  COMPARISON:  05/26/2008  FINDINGS: Gallbladder:  Surgically absent  Common bile duct:  Diameter: 4 mm  Liver:  Liver shows no focal abnormalities. The liver it is however enlarged to a span of 21 cm. There is diffusely heterogeneous/echogenic hepatic echotexture.  IMPRESSION: Nonspecific findings indicating hepatomegaly and hepatic parenchymal disease. This appearance is very similar to the prior study.   Electronically Signed   By: Skipper Cliche M.D.   On: 09/18/2014 08:34    2-D echo Left ventricle: The cavity size was normal. Wall thickness was normal. Systolic function was normal. The estimated ejection fraction was in the range of 60% to 65%. Wall motion was normal; there were no regional wall motion abnormalities. Doppler parameters are consistent with abnormal left ventricular relaxation (grade 1 diastolic dysfunction).   Microbiology: Recent Results (from the past 240 hour(s))  Culture, blood (routine x 2)     Status: None (Preliminary result)   Collection Time: 09/17/14  7:41 PM  Result Value Ref Range Status   Specimen Description BLOOD LEFT ANTECUBITAL  Final   Special Requests BOTTLES DRAWN AEROBIC AND ANAEROBIC 4 ML  Final   Culture  Setup Time   Final    09/17/2014 22:47 Performed at [facility]    Culture   Final           BLOOD CULTURE RECEIVED NO GROWTH TO DATE CULTURE WILL BE HELD FOR 5 DAYS BEFORE ISSUING A FINAL NEGATIVE REPORT Performed at [facility]    Report Status PENDING  Incomplete  Culture, blood (routine x 2)     Status: None (Preliminary result)   Collection Time: 09/17/14  7:48 PM  Result Value Ref Range Status   Specimen Description BLOOD LEFT HAND  Final   Special Requests BOTTLES DRAWN AEROBIC AND ANAEROBIC 4 ML  Final   Culture  Setup Time   Final    09/17/2014 22:48 Performed at [facility]    Culture   Final           BLOOD CULTURE RECEIVED NO GROWTH TO DATE CULTURE WILL  BE HELD FOR 5 DAYS BEFORE ISSUING A FINAL NEGATIVE REPORT Performed at [facility]    Report Status PENDING  Incomplete     Labs: Results for orders placed or performed during the hospital encounter of 09/17/14 (from the past 48 hour(s))  CBG monitoring, ED     Status: Abnormal   Collection Time:  09/17/14  1:24 PM  Result Value Ref Range   Glucose-Capillary 493 (H) 70 - 99 mg/dL  Urinalysis, Routine w reflex microscopic     Status: Abnormal   Collection Time: 09/17/14  2:25 PM  Result Value Ref Range   Color, Urine YELLOW YELLOW   APPearance CLEAR CLEAR   Specific Gravity, Urine 1.018 1.005 - 1.030   pH 5.5 5.0 - 8.0   Glucose, UA >1000 (A) NEGATIVE mg/dL   Hgb urine dipstick SMALL (A) NEGATIVE   Bilirubin Urine NEGATIVE NEGATIVE   Ketones, ur NEGATIVE NEGATIVE mg/dL   Protein, ur NEGATIVE NEGATIVE mg/dL   Urobilinogen, UA 0.2 0.0 - 1.0 mg/dL   Nitrite NEGATIVE NEGATIVE   Leukocytes, UA NEGATIVE NEGATIVE  Urine rapid drug screen (hosp performed)     Status: Abnormal   Collection Time: 09/17/14  2:25 PM  Result Value Ref Range   Opiates POSITIVE (A) NONE DETECTED   Cocaine NONE DETECTED NONE DETECTED   Benzodiazepines NONE DETECTED NONE DETECTED   Amphetamines NONE DETECTED NONE DETECTED   Tetrahydrocannabinol NONE DETECTED NONE DETECTED   Barbiturates NONE DETECTED NONE DETECTED    Comment:        DRUG SCREEN FOR MEDICAL PURPOSES ONLY.  IF CONFIRMATION IS NEEDED FOR ANY PURPOSE, NOTIFY LAB WITHIN 5 DAYS.        LOWEST DETECTABLE LIMITS FOR URINE DRUG SCREEN Drug Class       Cutoff (ng/mL) Amphetamine      1000 Barbiturate      200 Benzodiazepine   491 Tricyclics       791 Opiates          300 Cocaine          300 THC              50   Urine microscopic-add on     Status: Abnormal   Collection Time: 09/17/14  2:25 PM  Result Value Ref Range   Squamous Epithelial / LPF FEW (A) RARE   WBC, UA 3-6 <3 WBC/hpf   RBC / HPF 0-2 <3 RBC/hpf   Bacteria, UA  RARE RARE   Urine-Other AMORPHOUS URATES/PHOSPHATES   CBC with Differential     Status: Abnormal   Collection Time: 09/17/14  2:41 PM  Result Value Ref Range   WBC 22.1 (H) 4.0 - 10.5 K/uL   RBC 4.66 3.87 - 5.11 MIL/uL   Hemoglobin 13.8 12.0 - 15.0 g/dL   HCT 45.0 36.0 - 46.0 %   MCV 96.6 78.0 - 100.0 fL   MCH 29.6 26.0 - 34.0 pg   MCHC 30.7 30.0 - 36.0 g/dL   RDW 13.8 11.5 - 15.5 %   Platelets 320 150 - 400 K/uL   Neutrophils Relative % 90 (H) 43 - 77 %   Neutro Abs 20.1 (H) 1.7 - 7.7 K/uL   Lymphocytes Relative 6 (L) 12 - 46 %   Lymphs Abs 1.2 0.7 - 4.0 K/uL   Monocytes Relative 4 3 - 12 %   Monocytes Absolute 0.8 0.1 - 1.0 K/uL   Eosinophils Relative 0 0 - 5 %   Eosinophils Absolute 0.0 0.0 - 0.7 K/uL   Basophils Relative 0 0 - 1 %   Basophils Absolute 0.0 0.0 - 0.1 K/uL  Comprehensive metabolic panel     Status: Abnormal   Collection Time: 09/17/14  2:41 PM  Result Value Ref Range   Sodium 135 (L) 137 - 147 mEq/L   Potassium 4.9 3.7 -  5.3 mEq/L   Chloride 94 (L) 96 - 112 mEq/L   CO2 21 19 - 32 mEq/L   Glucose, Bld 431 (H) 70 - 99 mg/dL   BUN 18 6 - 23 mg/dL   Creatinine, Ser 1.03 0.50 - 1.10 mg/dL   Calcium 9.1 8.4 - 10.5 mg/dL   Total Protein 8.0 6.0 - 8.3 g/dL   Albumin 4.0 3.5 - 5.2 g/dL   AST 60 (H) 0 - 37 U/L    Comment: SLIGHT HEMOLYSIS HEMOLYSIS AT THIS LEVEL MAY AFFECT RESULT    ALT 47 (H) 0 - 35 U/L   Alkaline Phosphatase 148 (H) 39 - 117 U/L   Total Bilirubin <0.2 (L) 0.3 - 1.2 mg/dL   GFR calc non Af Amer 55 (L) >90 mL/min   GFR calc Af Amer 64 (L) >90 mL/min    Comment: (NOTE) The eGFR has been calculated using the CKD EPI equation. This calculation has not been validated in all clinical situations. eGFR's persistently <90 mL/min signify possible Chronic Kidney Disease.    Anion gap 20 (H) 5 - 15  Blood gas, arterial     Status: Abnormal   Collection Time: 09/17/14  2:50 PM  Result Value Ref Range   O2 Content 3.0 L/min   pH, Arterial 7.211 (L)  7.350 - 7.450   pCO2 arterial 57.9 (HH) 35.0 - 45.0 mmHg    Comment: CRITICAL RESULT CALLED TO, READ BACK BY AND VERIFIED WITH: DR Davy Pique AT [id number] BY LISA CRADDOCK,RRT,RCP ON 09/17/14    pO2, Arterial 77.0 (L) 80.0 - 100.0 mmHg   Bicarbonate 22.3 20.0 - 24.0 mEq/L   TCO2 20.8 0 - 100 mmol/L   Acid-base deficit 6.0 (H) 0.0 - 2.0 mmol/L   O2 Saturation 94.1 %   Patient temperature 98.6    Collection site BRACHIAL ARTERY    Drawn by [id number]    Sample type ARTERIAL DRAW    Allens test (pass/fail) PASS PASS  POC CBG, ED     Status: Abnormal   Collection Time: 09/17/14  4:36 PM  Result Value Ref Range   Glucose-Capillary 229 (H) 70 - 99 mg/dL  Blood gas, arterial     Status: Abnormal   Collection Time: 09/17/14  5:00 PM  Result Value Ref Range   FIO2 0.40 %   Delivery systems BILEVEL POSITIVE AIRWAY PRESSURE    Inspiratory PAP 14    Expiratory PAP 5    pH, Arterial 7.241 (L) 7.350 - 7.450   pCO2 arterial 58.5 (HH) 35.0 - 45.0 mmHg    Comment: CRITICAL RESULT CALLED TO, READ BACK BY AND VERIFIED WITH: DR GLICK,MD AT [id number] BY LISA CRADDOCK,RRT,RCP ON 09/17/14    pO2, Arterial 88.3 80.0 - 100.0 mmHg   Bicarbonate 24.3 (H) 20.0 - 24.0 mEq/L   TCO2 22.3 0 - 100 mmol/L   Acid-base deficit 3.7 (H) 0.0 - 2.0 mmol/L   O2 Saturation 96.2 %   Patient temperature 98.6    Collection site BRACHIAL ARTERY    Drawn by [id number]    Sample type ARTERIAL DRAW    Allens test (pass/fail) PASS PASS  Culture, blood (routine x 2)     Status: None (Preliminary result)   Collection Time: 09/17/14  7:41 PM  Result Value Ref Range   Specimen Description BLOOD LEFT ANTECUBITAL    Special Requests BOTTLES DRAWN AEROBIC AND ANAEROBIC 4 ML    Culture  Setup Time      09/17/2014 22:47 Performed at [facility]  Culture             BLOOD CULTURE RECEIVED NO GROWTH TO DATE CULTURE WILL BE HELD FOR 5 DAYS BEFORE ISSUING A FINAL NEGATIVE REPORT Performed at [facility]    Report Status  PENDING   Culture, blood (routine x 2)     Status: None (Preliminary result)   Collection Time: 09/17/14  7:48 PM  Result Value Ref Range   Specimen Description BLOOD LEFT HAND    Special Requests BOTTLES DRAWN AEROBIC AND ANAEROBIC 4 ML    Culture  Setup Time      09/17/2014 22:48 Performed at [facility] NO GROWTH TO DATE CULTURE WILL BE HELD FOR 5 DAYS BEFORE ISSUING A FINAL NEGATIVE REPORT Performed at [facility]    Report Status PENDING   Troponin I     Status: None   Collection Time: 09/17/14  7:52 PM  Result Value Ref Range   Troponin I <0.30 <0.30 ng/mL    Comment:        Due to the release kinetics of cTnI, a negative result within the first hours of the onset of symptoms does not rule out myocardial infarction with certainty. If myocardial infarction is still suspected, repeat the test at appropriate intervals.   Glucose, capillary     Status: Abnormal   Collection Time: 09/17/14 10:49 PM  Result Value Ref Range   Glucose-Capillary 300 (H) 70 - 99 mg/dL   Comment 1 Documented in Chart    Comment 2 Notify RN   Influenza panel by PCR (type A & B, H1N1)     Status: None   Collection Time: 09/18/14 12:07 AM  Result Value Ref Range   Influenza A By PCR NEGATIVE NEGATIVE   Influenza B By PCR NEGATIVE NEGATIVE   H1N1 flu by pcr NOT DETECTED NOT DETECTED    Comment:        The Xpert Flu assay (FDA approved for nasal aspirates or washes and nasopharyngeal swab specimens), is intended as an aid in the diagnosis of influenza and should not be used as a sole basis for treatment. Performed at [facility]   TSH     Status: None   Collection Time: 09/18/14  1:50 AM  Result Value Ref Range   TSH 0.787 0.350 - 4.500 uIU/mL    Comment: Performed at [facility]  Troponin I     Status: Abnormal   Collection Time: 09/18/14  1:50 AM  Result Value Ref Range   Troponin I 0.32 (HH) <0.30 ng/mL     Comment:        Due to the release kinetics of cTnI, a negative result within the first hours of the onset of symptoms does not rule out myocardial infarction with certainty. If myocardial infarction is still suspected, repeat the test at appropriate intervals. CRITICAL RESULT CALLED TO, READ BACK BY AND VERIFIED WITH: MADRIAGA,E RN AT 0249 11.28.15 BY TIBBITTS,K   Hepatitis panel, acute     Status: None   Collection Time: 09/18/14  1:50 AM  Result Value Ref Range   Hepatitis B Surface Ag NEGATIVE NEGATIVE   HCV Ab NEGATIVE NEGATIVE   Hep A IgM NON REACTIVE NON REACTIVE    Comment: (NOTE) Effective September 06, 2014, Hepatitis Acute Panel (test code [phone]) will be revised to automatically reflex to the Hepatitis C Viral  RNA, Quantitative, Real-Time PCR assay if the Hepatitis C antibody screening result is Reactive. This action is being taken to ensure that the CDC/USPSTF recommended HCV diagnostic algorithm with the appropriate test reflex needed for accurate interpretation is followed.    Hep B C IgM NON REACTIVE NON REACTIVE    Comment: (NOTE) High levels of Hepatitis B Core IgM antibody are detectable during the acute stage of Hepatitis B. This antibody is used to differentiate current from past HBV infection. Performed at [facility]   Magnesium     Status: None   Collection Time: 09/18/14  1:50 AM  Result Value Ref Range   Magnesium 1.8 1.5 - 2.5 mg/dL  Phosphorus     Status: None   Collection Time: 09/18/14  1:50 AM  Result Value Ref Range   Phosphorus 3.0 2.3 - 4.6 mg/dL  Comprehensive metabolic panel     Status: Abnormal   Collection Time: 09/18/14  1:50 AM  Result Value Ref Range   Sodium 135 (L) 137 - 147 mEq/L   Potassium 4.2 3.7 - 5.3 mEq/L   Chloride 94 (L) 96 - 112 mEq/L   CO2 23 19 - 32 mEq/L   Glucose, Bld 323 (H) 70 - 99 mg/dL   BUN 22 6 - 23 mg/dL   Creatinine, Ser 0.92 0.50 - 1.10 mg/dL   Calcium 9.3 8.4 - 10.5 mg/dL   Total Protein 7.3  6.0 - 8.3 g/dL   Albumin 3.7 3.5 - 5.2 g/dL   AST 40 (H) 0 - 37 U/L   ALT 39 (H) 0 - 35 U/L   Alkaline Phosphatase 117 39 - 117 U/L   Total Bilirubin 0.2 (L) 0.3 - 1.2 mg/dL   GFR calc non Af Amer 63 (L) >90 mL/min   GFR calc Af Amer 73 (L) >90 mL/min    Comment: (NOTE) The eGFR has been calculated using the CKD EPI equation. This calculation has not been validated in all clinical situations. eGFR's persistently <90 mL/min signify possible Chronic Kidney Disease.    Anion gap 18 (H) 5 - 15  CBC     Status: Abnormal   Collection Time: 09/18/14  1:50 AM  Result Value Ref Range   WBC 14.7 (H) 4.0 - 10.5 K/uL   RBC 4.10 3.87 - 5.11 MIL/uL   Hemoglobin 12.3 12.0 - 15.0 g/dL   HCT 38.2 36.0 - 46.0 %   MCV 93.2 78.0 - 100.0 fL   MCH 30.0 26.0 - 34.0 pg   MCHC 32.2 30.0 - 36.0 g/dL   RDW 13.6 11.5 - 15.5 %   Platelets 307 150 - 400 K/uL  Hemoglobin A1c     Status: Abnormal   Collection Time: 09/18/14  1:50 AM  Result Value Ref Range   Hgb A1c MFr Bld 7.9 (H) <5.7 %    Comment: (NOTE)                                                                       According to the ADA Clinical Practice Recommendations for 2011, when HbA1c is used as a screening test:  >=6.5%   Diagnostic of Diabetes Mellitus           (if abnormal result is confirmed)  5.7-6.4%   Increased risk of developing Diabetes Mellitus References:Diagnosis and Classification of Diabetes Mellitus,Diabetes NLGX,2119,41(DEYCX 1):S62-S69 and Standards of Medical Care in         Diabetes - 2011,Diabetes KGYJ,8563,14 (Suppl 1):S11-S61.    Mean Plasma Glucose 180 (H) <117 mg/dL    Comment: Performed at [facility]  Troponin I     Status: Abnormal   Collection Time: 09/18/14  7:18 AM  Result Value Ref Range   Troponin I 0.37 (HH) <0.30 ng/mL    Comment:        Due to the release kinetics of cTnI, a negative result within the first hours of the onset of symptoms does not rule out myocardial infarction with  certainty. If myocardial infarction is still suspected, repeat the test at appropriate intervals. CRITICAL VALUE NOTED.  VALUE IS CONSISTENT WITH PREVIOUSLY REPORTED AND CALLED VALUE.   Glucose, capillary     Status: Abnormal   Collection Time: 09/18/14  7:39 AM  Result Value Ref Range   Glucose-Capillary 287 (H) 70 - 99 mg/dL   Comment 1 Documented in Chart    Comment 2 Notify RN   Glucose, capillary     Status: Abnormal   Collection Time: 09/18/14 12:07 PM  Result Value Ref Range   Glucose-Capillary 352 (H) 70 - 99 mg/dL   Comment 1 Documented in Chart    Comment 2 Notify RN   Glucose, capillary     Status: Abnormal   Collection Time: 09/18/14  4:59 PM  Result Value Ref Range   Glucose-Capillary 377 (H) 70 - 99 mg/dL   Comment 1 Documented in Chart    Comment 2 Notify RN   Glucose, capillary     Status: Abnormal   Collection Time: 09/18/14  9:08 PM  Result Value Ref Range   Glucose-Capillary 270 (H) 70 - 99 mg/dL   Comment 1 Notify RN   CBC     Status: Abnormal   Collection Time: 09/19/14  5:04 AM  Result Value Ref Range   WBC 15.1 (H) 4.0 - 10.5 K/uL   RBC 3.82 (L) 3.87 - 5.11 MIL/uL   Hemoglobin 11.5 (L) 12.0 - 15.0 g/dL   HCT 35.0 (L) 36.0 - 46.0 %   MCV 91.6 78.0 - 100.0 fL   MCH 30.1 26.0 - 34.0 pg   MCHC 32.9 30.0 - 36.0 g/dL   RDW 13.9 11.5 - 15.5 %   Platelets 304 150 - 400 K/uL  Comprehensive metabolic panel     Status: Abnormal   Collection Time: 09/19/14  5:04 AM  Result Value Ref Range   Sodium 140 137 - 147 mEq/L   Potassium 4.0 3.7 - 5.3 mEq/L   Chloride 101 96 - 112 mEq/L   CO2 24 19 - 32 mEq/L   Glucose, Bld 281 (H) 70 - 99 mg/dL   BUN 22 6 - 23 mg/dL   Creatinine, Ser 0.76 0.50 - 1.10 mg/dL   Calcium 9.5 8.4 - 10.5 mg/dL   Total Protein 6.9 6.0 - 8.3 g/dL   Albumin 3.6 3.5 - 5.2 g/dL   AST 28 0 - 37 U/L   ALT 36 (H) 0 - 35 U/L   Alkaline Phosphatase 102 39 - 117 U/L   Total Bilirubin 0.4 0.3 - 1.2 mg/dL   GFR calc non Af Amer 85 (L) >90  mL/min   GFR calc Af Amer >90 >90 mL/min    Comment: (NOTE) The eGFR has been calculated using the CKD EPI  equation. This calculation has not been validated in all clinical situations. eGFR's persistently <90 mL/min signify possible Chronic Kidney Disease.    Anion gap 15 5 - 15  Glucose, capillary     Status: Abnormal   Collection Time: 09/19/14  7:28 AM  Result Value Ref Range   Glucose-Capillary 276 (H) 70 - 99 mg/dL   Comment 1 Documented in Chart    Comment 2 Notify RN   Troponin I     Status: None   Collection Time: 09/19/14  9:40 AM  Result Value Ref Range   Troponin I <0.30 <0.30 ng/mL    Comment:        Due to the release kinetics of cTnI, a negative result within the first hours of the onset of symptoms does not rule out myocardial infarction with certainty. If myocardial infarction is still suspected, repeat the test at appropriate intervals.      HPI :Suprina Mandeville Hoopingarner is a 68 y.o. female  has a past medical history of Diabetes mellitus without complication and Hypertension.   Presented with  Patient have been coughing for the past 2 weeks. Denies coughing up anything. No fever but some chills and night sweats. Patient continues to smoke 1-2 per day. Patient was given some cough syrup from her son's girlfriend that had hydrocodone in it. She was using cognac and honey to help with coughing until she run out.  Patient also takes daily Ativan for anxiety. Patient started to become somnolent. Her Son was unable to wake her up. EMS was called on arrival to ER she had an ABG 7.211/57.9/77 she was placed on BiPAP and now improved able to have a conversation on 2L [place]. She is not on oxygen at home.   Hospitalist was called for admission for hypercarbic acidosis and somnolence  HOSPITAL COURSE Present on Admission:  . COPD exacerbation - -started on IV steroids, empiric, antibiotics, Albuterol PRN, scheduled Atrovent, Dulera and Mucinex. 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 Hypercarbia - patient is currently alert avoid over sedation medications  . DM type 2 with diabetic dyslipidemia -increase lantus to 35 units from 25 units because of the fact that the patient is on prednisone, this may be cut back to the home dose once the patient completes her prednisone taper   . Elevated LFTs - resume statins, negative acute hepatitis panel, RUQ US shows nonspecific hepatomegaly  SIRS persistent leukocytosis but-improving, likely secondary to steroids, blood culture negative thus far, Patient remains afebrile at the time of discharge  Alcohol Use - reports no hx of abuse, this was occasional use,  Tobacco abuse not interested in quiting, patient being discharged home on a nicotine patch    Discharge Exam:  Blood pressure 120/49, pulse 73, temperature 97.4 F (36.3 C), temperature source Oral, resp. rate 18, height _0  (1.575 m), weight 87.9 kg (193 lb 12.6 oz), SpO2  96 %.   General: alert & oriented x 3 In NAD   Cardiovascular: RRR, nl S1 s2   Respiratory: Decreased breath sounds at the bases, scattered rhonchi, no crackles   Abdomen: soft +BS NT/ND, no masses palpable   Extremities: No cyanosis and no edema            Follow-up Information     Follow up with TAPPER,DAVID B, MD. Schedule an appointment as soon as possible for a visit in 1 week.   Specialty:  Family Medicine   Contact information:   [place] [address] [phone]       Signed: Reyne Dumas 09/19/2014, 11:32 AM

## 2014-09-19 NOTE — Plan of Care (Signed)
Problem: Phase I Progression Outcomes Goal: Pain controlled with appropriate interventions Outcome: Completed/Met Date Met:  09/19/14     

## 2014-09-19 NOTE — Progress Notes (Signed)
Pt oxygen saturation 96% on RA at rest. Pt ambulated 400 feet and oxygen saturation 97%RA.

## 2014-09-20 LAB — BLOOD GAS, ARTERIAL
ACID-BASE DEFICIT: 6 mmol/L — AB (ref 0.0–2.0)
BICARBONATE: 22.3 meq/L (ref 20.0–24.0)
Drawn by: 331471
O2 Content: 3 L/min
O2 SAT: 94.1 %
Patient temperature: 98.6
TCO2: 20.8 mmol/L (ref 0–100)
pCO2 arterial: 57.9 mmHg (ref 35.0–45.0)
pH, Arterial: 7.211 — ABNORMAL LOW (ref 7.350–7.450)
pO2, Arterial: 77 mmHg — ABNORMAL LOW (ref 80.0–100.0)

## 2014-09-23 LAB — CULTURE, BLOOD (ROUTINE X 2)
CULTURE: NO GROWTH
Culture: NO GROWTH

## 2016-01-31 IMAGING — DX DG CHEST 1V PORT
1 series · 1 of 1 positions shown · non-contrast
Comparison: Portable exam 0302 hr compared to 11/08/2009

CLINICAL DATA: Unresponsive this morning when family tried to [place]
her, cough and shortness of breath for 1 day, history smoking, COPD,
diabetes

EXAM:
PORTABLE CHEST - 1 VIEW

[AP]
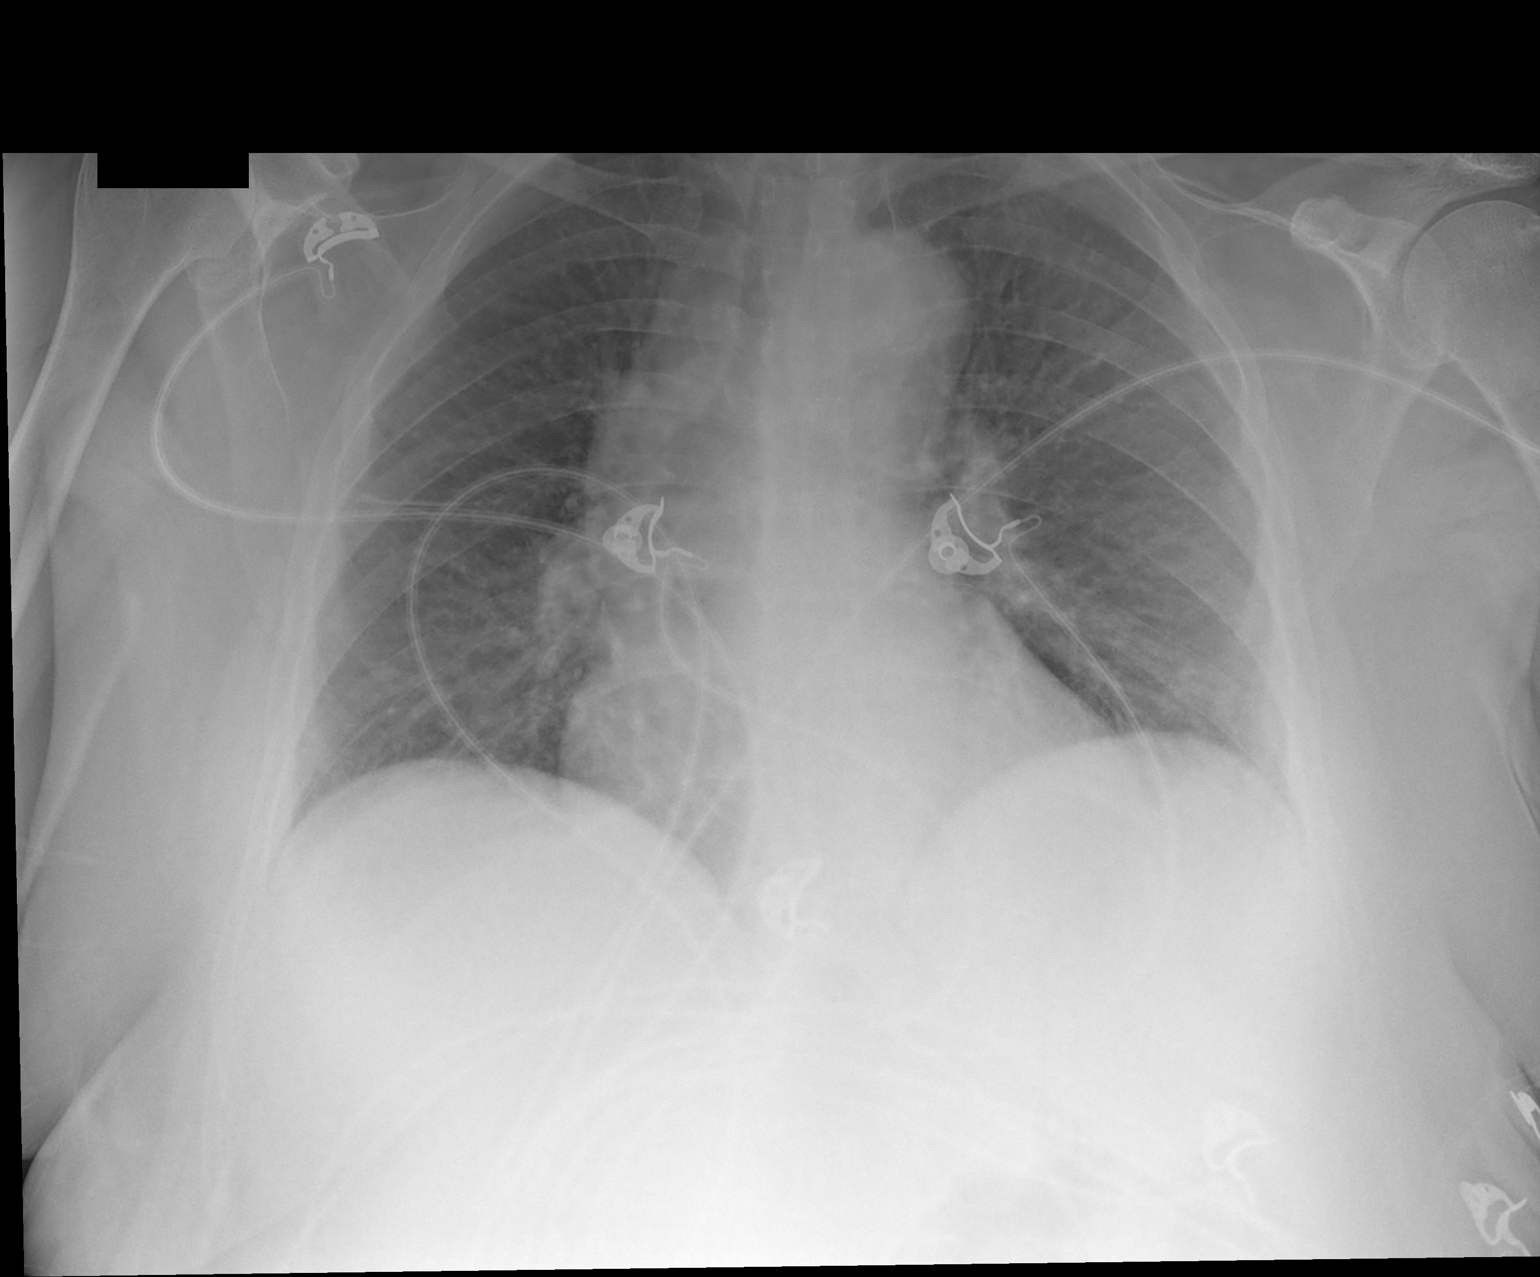

[1 of 1 positions shown; findings below may reference images not displayed]

FINDINGS: Minimal enlargement of cardiac silhouette.

Mediastinal contours and pulmonary vascularity grossly normal for
technique.

Lungs clear.

No pleural effusion or pneumothorax.

Bones unremarkable.
IMPRESSION: Minimal enlargement of cardiac silhouette.

No acute abnormalities.

## 2016-02-01 IMAGING — US US ABDOMEN LIMITED
1 series · 14 of 22 positions shown · non-contrast
Comparison: 05/26/2008

CLINICAL DATA: Initial evaluation for increased liver function
enzymes

EXAM:
US ABDOMEN LIMITED - RIGHT UPPER QUADRANT

[Series 1: us abdomen limited · 0.26mm/px · 14 of 22 slices shown]
[im 1/22]
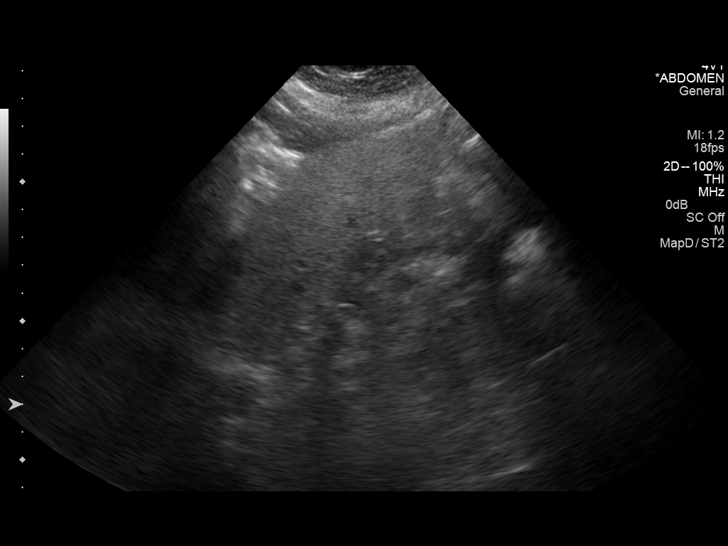
[im 3/22]
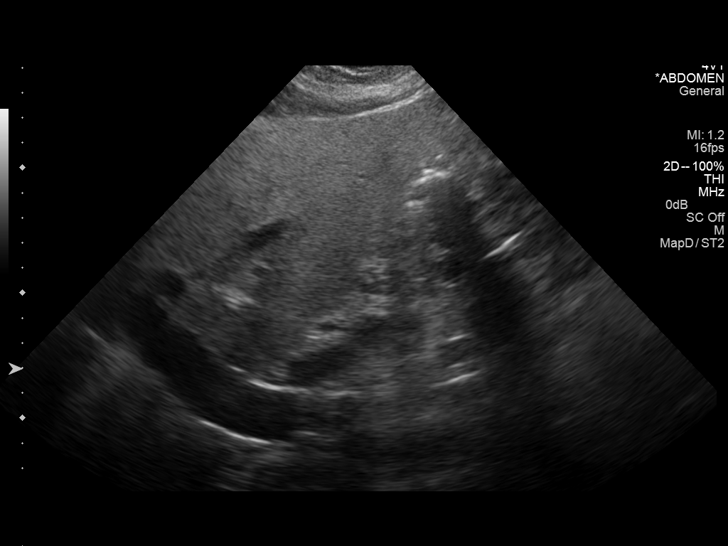
[im 4/22]
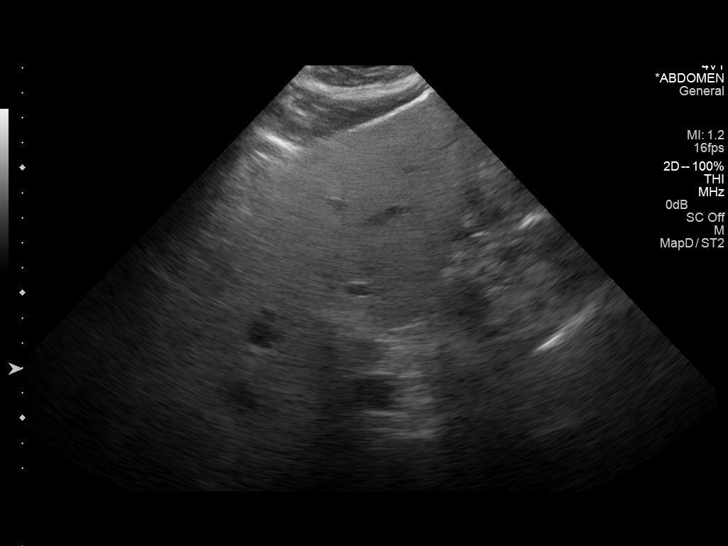
[im 6/22]
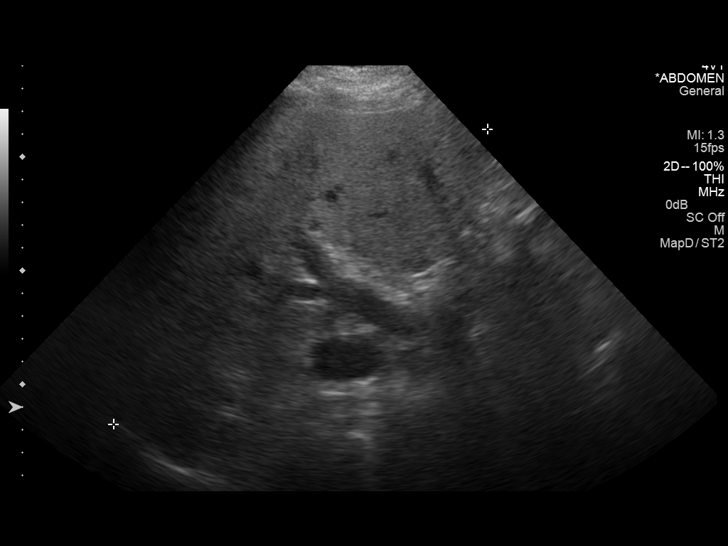
[im 8/22]
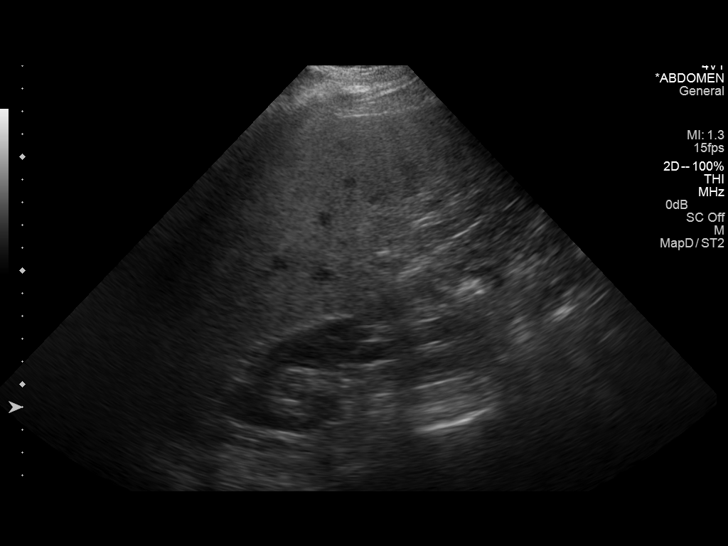
[im 9/22]
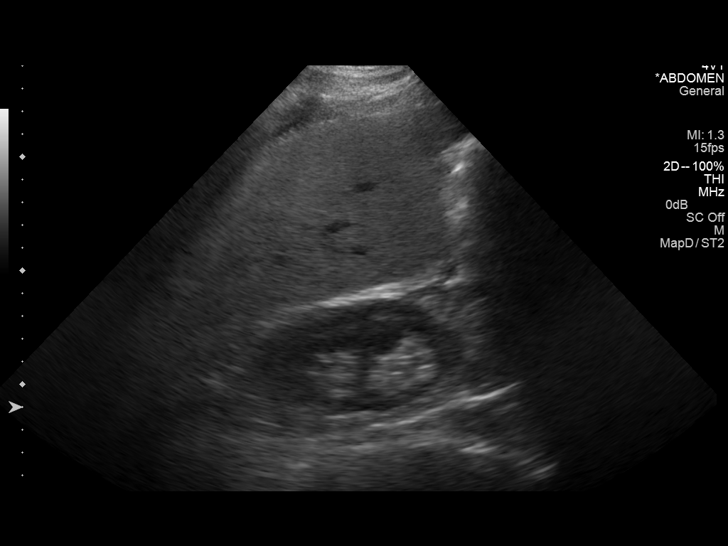
[im 11/22]
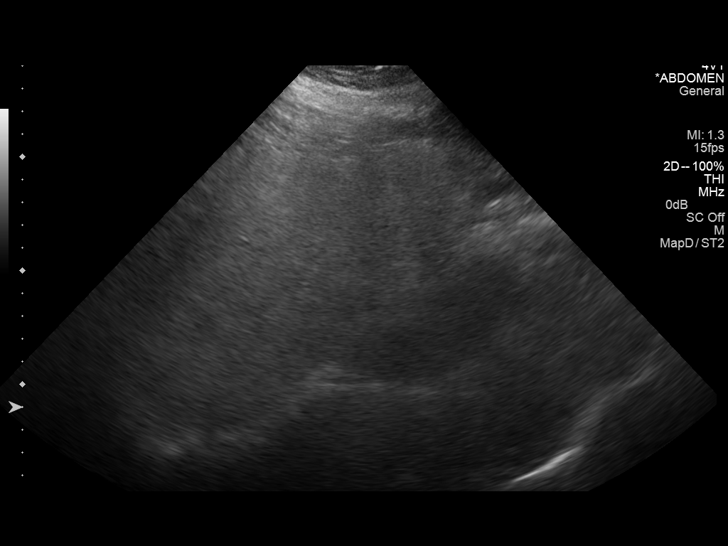
[im 12/22]
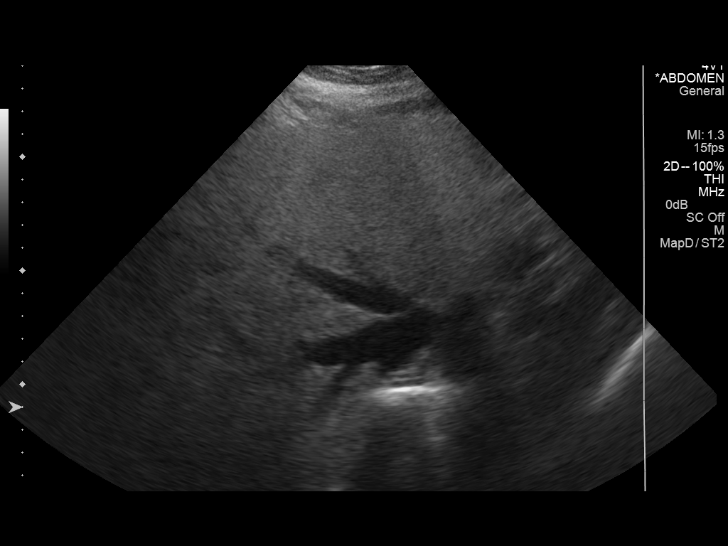
[im 14/22]
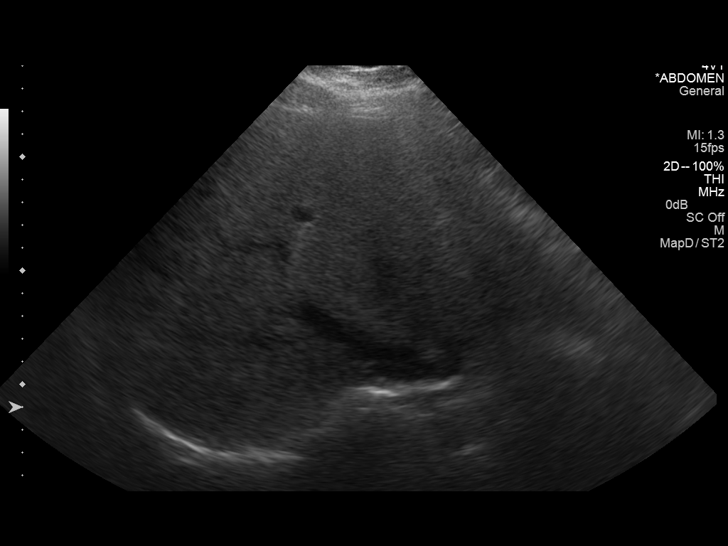
[im 15/22]
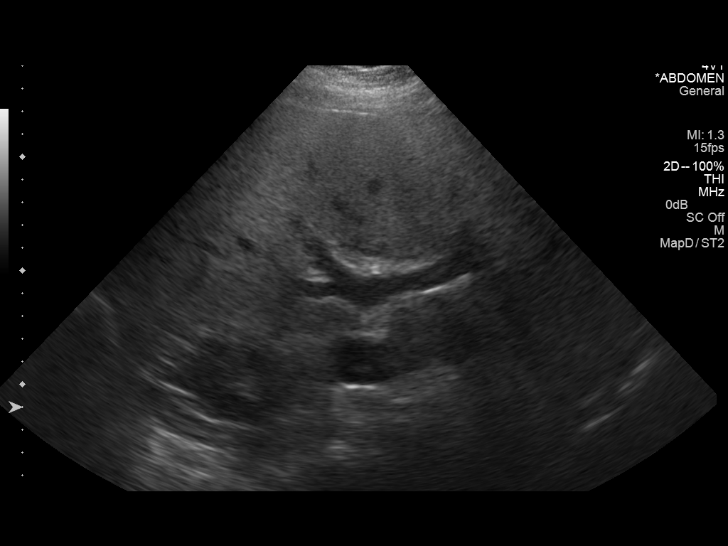
[im 17/22]
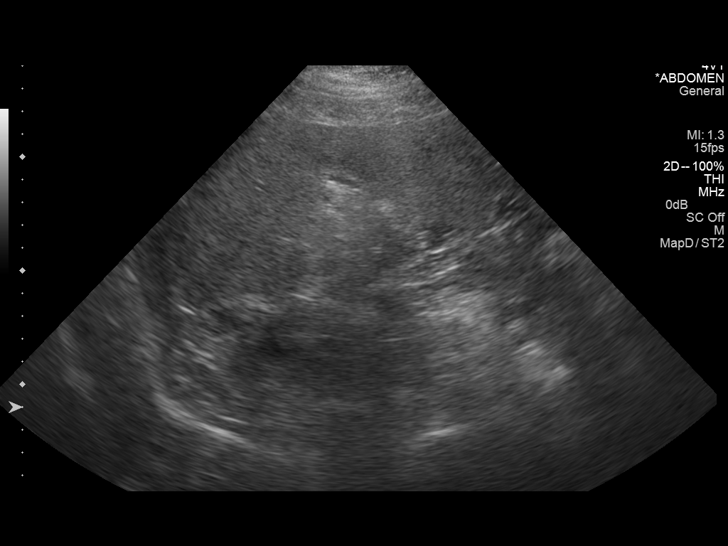
[im 19/22]
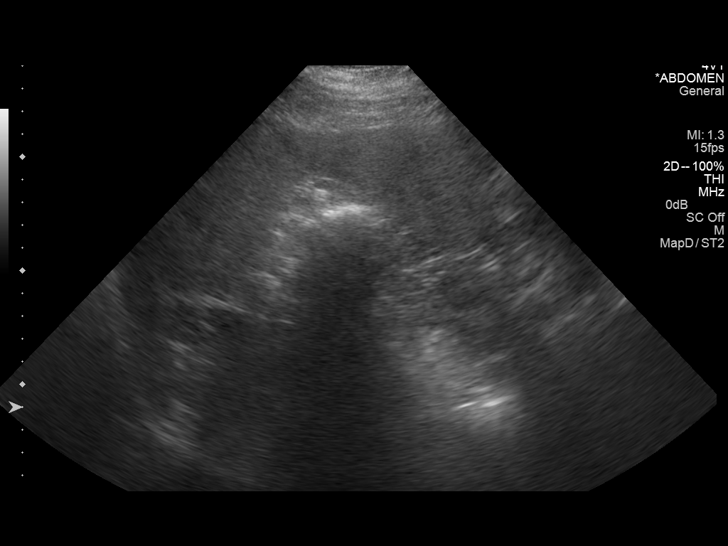
[im 20/22]
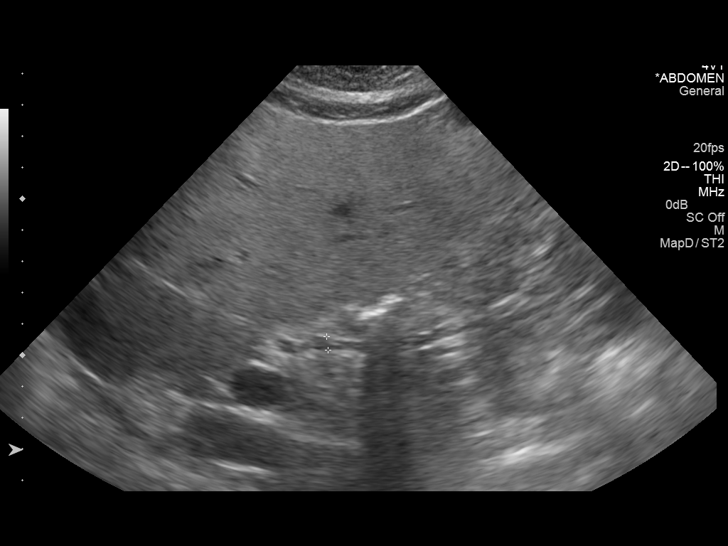
[im 22/22]
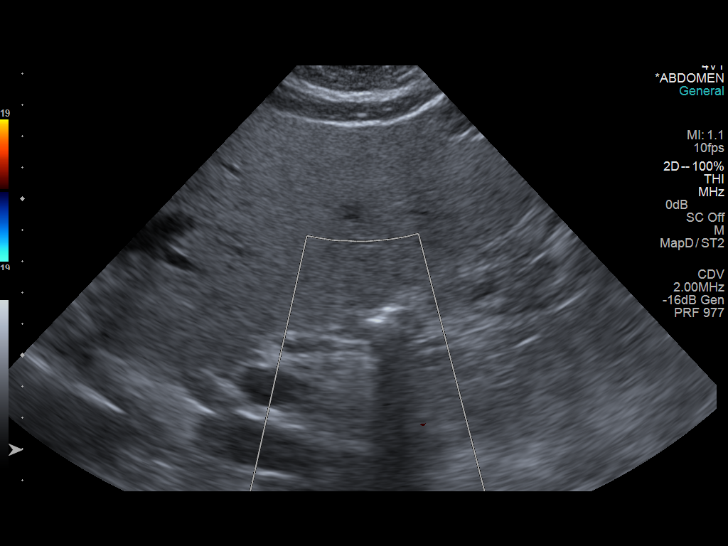

[14 of 22 positions shown; findings below may reference images not displayed]

FINDINGS: Gallbladder:

Surgically absent

Common bile duct:

Diameter: 4 mm

Liver:

Liver shows no focal abnormalities. The liver it is however enlarged
to a span of 21 cm. There is diffusely heterogeneous/echogenic
hepatic echotexture.
IMPRESSION: Nonspecific findings indicating hepatomegaly and hepatic parenchymal
disease. This appearance is very similar to the prior study.

## 2016-05-10 ENCOUNTER — Ambulatory Visit: Payer: Medicare Other | Admitting: Neurology

## 2016-05-28 ENCOUNTER — Encounter: Payer: Self-pay | Admitting: Neurology

## 2016-05-28 ENCOUNTER — Ambulatory Visit (INDEPENDENT_AMBULATORY_CARE_PROVIDER_SITE_OTHER): Payer: Medicare Other | Admitting: Neurology

## 2016-05-28 VITALS — BP 120/62 | HR 70 | Resp 16 | Ht 62.5 in | Wt 194.0 lb

## 2016-05-28 DIAGNOSIS — M5442 Lumbago with sciatica, left side: Secondary | ICD-10-CM

## 2016-05-28 DIAGNOSIS — E0842 Diabetes mellitus due to underlying condition with diabetic polyneuropathy: Secondary | ICD-10-CM | POA: Diagnosis not present

## 2016-05-28 DIAGNOSIS — Z720 Tobacco use: Secondary | ICD-10-CM

## 2016-05-28 DIAGNOSIS — F17200 Nicotine dependence, unspecified, uncomplicated: Secondary | ICD-10-CM

## 2016-05-28 NOTE — Progress Notes (Signed)
Subjective:    Patient ID: Georjean Kichline Deweese is a 70 y.o. female.  HPI     Star Age, MD, PhD [facility] [address] [place].O. [place],  [address]  Dear Dr. Joya Salm,   I saw your patient, Shemariah Triner, upon your kind request in my neurologic clinic today for initial consultation of left leg numbness. The patient is accompanied by her daughter today. As you know, Ms. Baucom is a 70 year old right-handed woman with an underlying medical history of hypertension, diabetes, COPD, arthritis, depression, pacemaker placement, chronic back pain of over 17 years duration, and smoking, who reports a long-standing history of numbness and tingling of both feet, left more than right. She has been diabetic for at least 6 years per daughter. She lives with her husband and her 44 year old grandson. She has fallen occasionally, thankfully no recent major injuries. She has a history of left femur fracture in 2000, with hardware placed which was then removed in 2001. Her last A1c is around 7 she reports. She has some numbness and tingling in her hands as well. She has had low back pain for years, she had injections into her lower back. She also reports other joint pains including both knees, she had seen orthopedics at [facility] years ago and also received injections into both knees in the past. She is currently on Percocet 5 mg strength 1-1/2 pills 3 times a day per primary care physician. She feels that this is not enough pain control. She also takes gabapentin, 600 mg in the morning and afternoon, 900 mg at night. She does report difficulty with her balance. She tries to drink enough water, per daughter she drinks a lot of water but also a lot of coffee. She smokes and continues to smoke. Of note, she has a family history of heart disease and stroke. I reviewed your office note from 03/26/2016, which you kindly included. She was not felt to have any weakness, reflexes were 1+,  lumbar spine x-rays showed no evidence of subluxation. She had a lumbar spine MRI on 03/02/2016 which I reviewed: Largely unchanged MRI appearance of the lumbar spine since 2012. Overall spinal degeneration is mild for age and there is no spinal or lateral recess stenosis. Request small left side disc herniations at T12-L1 and L4-L5 since the prior study. No new or increased neural impingement identified to explain increasing symptoms.  Her Past Medical History Is Significant For: Past Medical History:  Diagnosis Date  . Arthritis    degenerative bone disease  . COPD (chronic obstructive pulmonary disease) ([place])   . Depression   . Diabetes mellitus without complication ([place])   . GERD (gastroesophageal reflux disease)   . Hypertension   . Presence of permanent cardiac pacemaker   . Shortness of breath dyspnea     Her Past Surgical History Is Significant For: Past Surgical History:  Procedure Laterality Date  . bilateral knee surg repair 2007  2007   left femur fracture repair also  . CHOLECYSTECTOMY    . EYE SURGERY  2008   bilateral cataract removal    Her Family History Is Significant For: Family History  Problem Relation Age of Onset  . CAD Mother   . Heart attack Mother   . Heart attack Father     Her Social History Is Significant For: Social History   Social History  . Marital status: Married    Spouse name: N/A  . Number of children: N/A  . Years  of education: N/A   Occupational History  . Retired     Social History Main Topics  . Smoking status: Current Every Day Smoker    Packs/day: 1.00  . Smokeless tobacco: None  . Alcohol use No     Comment: rare  . Drug use: No  . Sexual activity: Not Asked   Other Topics Concern  . None   Social History Narrative   Drinks about 3-4 cups of coffee a day     Her Allergies Are:  Allergies  Allergen Reactions  . Ace Inhibitors Cough  . Nsaids     Leg edema  :   Her Current Medications Are:  Outpatient  Encounter Prescriptions as of 05/28/2016  Medication Sig  . aspirin EC 81 MG EC tablet Take 1 tablet (81 mg total) by mouth daily.  . budesonide-formoterol (SYMBICORT) 80-4.5 MCG/ACT inhaler Inhale 2 puffs into the lungs 2 (two) times daily.  . citalopram (CELEXA) 20 MG tablet Take 20 mg by mouth daily.  . fexofenadine (ALLEGRA) 60 MG tablet Take 60 mg by mouth 2 (two) times daily.  Marland Kitchen gabapentin (NEURONTIN) 300 MG capsule Take 600-900 mg by mouth 3 (three) times daily. 600 IN THE AM AND AFTERNOON AND 900 MG AT BEDTIME  . hydrOXYzine (ATARAX/VISTARIL) 25 MG tablet Take 25 mg by mouth 4 (four) times daily.  . insulin glargine (LANTUS) 100 UNIT/ML injection Inject 0.35 mLs (35 Units total) into the skin daily at 10 pm.  . levalbuterol (XOPENEX HFA) 45 MCG/ACT inhaler Inhale 2 puffs into the lungs every 6 (six) hours as needed for wheezing.  Marland Kitchen LORazepam (ATIVAN) 1 MG tablet Take 0.5 tablets (0.5 mg total) by mouth every 8 (eight) hours as needed for anxiety or sleep. Anxiety or sleep  . metoprolol succinate (TOPROL XL) 25 MG 24 hr tablet Take 1 tablet (25 mg total) by mouth daily.  . mirtazapine (REMERON) 30 MG tablet Take 30 mg by mouth at bedtime.  . Multiple Vitamins-Minerals (ZINC PO) Take by mouth.  . nicotine (NICODERM CQ - DOSED IN MG/24 HOURS) 21 mg/24hr patch Place 1 patch (21 mg total) onto the skin daily.  Marland Kitchen omeprazole (PRILOSEC) 20 MG capsule Take 20 mg by mouth 2 (two) times daily.  Marland Kitchen oxyCODONE-acetaminophen (PERCOCET/ROXICET) 5-325 MG tablet Take 1.5 tablets by mouth 3 (three) times daily.  . potassium citrate (UROCIT-K) 10 MEQ (1080 MG) SR tablet Take 10 mEq by mouth 2 (two) times daily.  . ranitidine (ZANTAC) 150 MG tablet Take 300 mg by mouth daily after supper.  . simvastatin (ZOCOR) 40 MG tablet Take 40 mg by mouth at bedtime.  Marland Kitchen tiZANidine (ZANAFLEX) 4 MG capsule Take 2 mg by mouth 3 (three) times daily.  Marland Kitchen triamterene-hydrochlorothiazide (MAXZIDE-25) 37.5-25 MG per tablet Take 1  tablet by mouth daily.  . [DISCONTINUED] guaiFENesin-dextromethorphan (ROBITUSSIN DM) 100-10 MG/5ML syrup Take 5 mLs by mouth every 4 (four) hours as needed for cough.  . [DISCONTINUED] levocetirizine (XYZAL) 5 MG tablet Take 5 mg by mouth every evening.  . [DISCONTINUED] levofloxacin (LEVAQUIN) 500 MG tablet Take 1 tablet (500 mg total) by mouth daily.  . [DISCONTINUED] predniSONE (DELTASONE) 10 MG tablet 5 tablets for 4 days 4 tablets for 4 days 3 tablets for 4 days 2 tablets 4 days 1 tablet for 4 days   No facility-administered encounter medications on file as of 05/28/2016.   :   Review of Systems:  Out of a complete 14 point review of systems, all are reviewed and  negative with the exception of these symptoms as listed below:  Review of Systems  Neurological:       Patient c/o numbness in both legs. H/O surgery on L Femur and has had numbness around surgery site since surgery. H/O back pain.      Objective:  Neurologic Exam  Physical Exam Physical Examination:   Vitals:   05/28/16 1604  BP: 120/62  Pulse: 70  Resp: 16   General Examination: The patient is a very pleasant 70 y.o. female in no acute distress. She appears well-developed and well-nourished and well groomed.   HEENT: Normocephalic, atraumatic, pupils are equal, round and reactive to light and accommodation. Funduscopic exam is normal with sharp disc margins noted. Extraocular tracking is good without limitation to gaze excursion or nystagmus noted. Normal smooth pursuit is noted. Hearing is impaired. Face is symmetric with normal facial animation and normal facial sensation. Speech is clear with no dysarthria noted. There is no hypophonia. There is no lip, neck/head, jaw or voice tremor. Neck is supple with full range of passive and active motion. There are no carotid bruits on auscultation. Oropharynx exam reveals: moderate mouth dryness, adequate dental hygiene. Tongue protrudes centrally and palate elevates  symmetrically.  Chest: Clear to auscultation without wheezing, rhonchi or crackles noted.  Heart: S1+S2+0, regular and normal without murmurs, rubs or gallops noted.   Abdomen: Soft, non-tender and non-distended with normal bowel sounds appreciated on auscultation.  Extremities: There is no pitting edema in the distal lower extremities bilaterally. Pedal pulses are intact.  Skin: Warm and dry without trophic changes noted. There are no varicose veins.  Musculoskeletal: exam reveals: She reports low back pain, at times radiating to the left, she reports bilateral knee pain, denies hip pain. She denies an area of pain and numbness around the scar where she had femur surgery on the L.   Neurologically:  Mental status: The patient is awake, alert and oriented in all 4 spheres. Her immediate and remote memory, attention, language skills and fund of knowledge are appropriate. There is no evidence of aphasia, agnosia, apraxia or anomia. Speech is clear with normal prosody and enunciation. Thought process is linear. Mood is normal and affect is normal.  Cranial nerves II - XII are as described above under HEENT exam. In addition: shoulder shrug is normal with equal shoulder height noted. Motor exam: Normal bulk, strength and tone is noted. There is no drift, tremor or rebound. Romberg is negative. Reflexes are 1+ throughout. Babinski: Toes are flexor bilaterally. Fine motor skills and coordination: intact with normal finger taps, normal hand movements, normal rapid alternating patting, normal foot taps and normal foot agility.  Cerebellar testing: No dysmetria or intention tremor on finger to nose testing. Heel to shin is unremarkable bilaterally. There is no truncal or gait ataxia.  Sensory exam: intact to light touch, pinprick, vibration, temperature sense in the upper extremities, decrease to all modalities in the distal lower extremities, to above ankles bilaterally, she feels that symptoms are worse  on the left.  Gait, station and balance: She stands with difficulty. No veering to one side is noted. No leaning to one side is noted. Posture is age-appropriate and stance is narrow based. Gait shows cautious gait. She has no significant limp. She turns slowly. Tandem walk is difficult for her.  Assessment and Plan:   In summary, Onita F Fanton is a very pleasant 70 y.o.-year old female with an underlying medical history of hypertension, diabetes, COPD, arthritis, depression, pacemaker  placement, chronic back pain of over 17 years duration, and smoking, who reports a long-standing history of numbness and tingling of both feet, left more than right. Her history and physical exam are in keeping with neuropathy, most likely diabetic polyneuropathy. Her back pain is managed by her primary care physician. She had further testing under your care. We will proceed with an EMG and nerve conduction test to further delineate her neuropathy. She is already on gabapentin. I would not recommend increasing this as she is already reporting balance problems, in addition, narcotic pain medications can cause balance problems and daytime sleepiness. She is advised to stay well-hydrated and use at least her cane, have not her walker for gait safety. I will see her back routinely in a couple months, we will keep her posted as to her EMG and nerve conduction test results via phone call. I answered all their questions today and the patient and her daughter were in agreement.  Thank you very much for allowing me to participate in the care of this nice patient. If I can be of any further assistance to you please do not hesitate to call me at [phone].  Sincerely,   Star Age, MD, PhD

## 2016-05-28 NOTE — Patient Instructions (Addendum)
We will do an EMG and nerve conduction velocity test, which is an electrical nerve and muscle test, which we will schedule. We will call you with the results. We will try to see if your test shows evidence of neuropathy. You have a history of neuropathy, likely diabetic.

## 2016-06-29 ENCOUNTER — Ambulatory Visit (INDEPENDENT_AMBULATORY_CARE_PROVIDER_SITE_OTHER): Payer: Medicare Other | Admitting: Neurology

## 2016-06-29 ENCOUNTER — Ambulatory Visit (INDEPENDENT_AMBULATORY_CARE_PROVIDER_SITE_OTHER): Payer: Self-pay | Admitting: Neurology

## 2016-06-29 DIAGNOSIS — M5442 Lumbago with sciatica, left side: Secondary | ICD-10-CM

## 2016-06-29 DIAGNOSIS — Z0289 Encounter for other administrative examinations: Secondary | ICD-10-CM

## 2016-06-29 DIAGNOSIS — M545 Low back pain, unspecified: Secondary | ICD-10-CM | POA: Insufficient documentation

## 2016-06-29 DIAGNOSIS — F17200 Nicotine dependence, unspecified, uncomplicated: Secondary | ICD-10-CM

## 2016-06-29 DIAGNOSIS — E0842 Diabetes mellitus due to underlying condition with diabetic polyneuropathy: Secondary | ICD-10-CM | POA: Diagnosis not present

## 2016-06-29 DIAGNOSIS — M542 Cervicalgia: Secondary | ICD-10-CM

## 2016-06-29 DIAGNOSIS — E1169 Type 2 diabetes mellitus with other specified complication: Secondary | ICD-10-CM

## 2016-06-29 DIAGNOSIS — R202 Paresthesia of skin: Secondary | ICD-10-CM | POA: Insufficient documentation

## 2016-06-29 DIAGNOSIS — E785 Hyperlipidemia, unspecified: Principal | ICD-10-CM

## 2016-06-29 DIAGNOSIS — G8929 Other chronic pain: Secondary | ICD-10-CM | POA: Insufficient documentation

## 2016-06-29 NOTE — Progress Notes (Signed)
EMG nerve conduction study report is under procedure section

## 2016-06-29 NOTE — Procedures (Signed)
   NCS (NERVE CONDUCTION STUDY) WITH EMG (ELECTROMYOGRAPHY) REPORT   STUDY DATE: September eighth 2017 PATIENT NAME: Maureen Hunt DOB: 1946-10-07 MRN: [id number]    TECHNOLOGIST: Laretta Alstrom ELECTROMYOGRAPHER: Marcial Pacas M.D.  CLINICAL INFORMATION:  70 years old female with history of diabetes, history left femur fracture require surgical fixation in the past, baseline gait abnormality, chronic low back pain, neck pain, presented with few months history of left leg numbness from left to waist down, intermittent right foot numbness from ankle down, she also reported episodes of bilateral lower extremity give out underneath her, numbness from waist down, chronic constipation, occasionally urinary incontinence.  On examination: Bilateral upper and lower extremity motor strength is normal, mild spasticity of bilateral lower extremity, preserved bilateral fingertips and the foot sensory response to light touch, vibratory sensation pinprick. Deep tendon reflexes were brisk at bilateral upper and lower extremities, plantar responses were extensor bilaterally. She ambulate with a wide based, stiff, cautious mildly unsteady gait.  FINDINGS: NERVE CONDUCTION STUDY: Bilateral peroneal sensory responses were normal. Bilateral peroneal to EDB and tibial motor responses were normal. The lateral tibial H reflexes were present and symmetric.   NEEDLE ELECTROMYOGRAPHY: Selective needle examinations were performed at bilateral lower extremity muscles, bilateral lumbar sacral paraspinal muscles, left upper extremity muscles, and left cervical paraspinal muscles.  Needle examination of bilateral tibialis anterior, tibialis posterior, medial gastrocnemius, vastus lateralis, biceps femoris long head, left peroneal longus was normal.  There was no spontaneous activity at bilateral lumbar sacral paraspinal muscles, bilateral L4-5 S1.  Needle examination of left first dorsal interossei, pronator teres,  biceps, triceps, deltoid was normal.  There was no spontaneous activity at left cervical paraspinal muscles, left C5-6 and 7.  IMPRESSION:   This is a normal study, there is no electrodiagnostic evidence of left cervical radiculopathy, bilateral lumbar sacral radiculopathy, or large fiber peripheral neuropathy.  She has significant gait difficulty, upper motor neuron signs on examinations.   INTERPRETING PHYSICIAN:   Marcial Pacas M.D. Ph.D. [facility] [address], [place], [place] [address] [phone]

## 2016-06-29 NOTE — Progress Notes (Signed)
Subjective:    Patient ID: Maureen Hunt is a 70 y.o. female.  HPI     Star Age, MD, PhD [facility] [address] [place], [place] [address]  Dear Dr. Joya Salm,   I saw your patient, Maureen Hunt, upon your kind request in my neurologic clinic today for initial consultation of left leg numbness. The patient is accompanied by her daughter today. As you know, Maureen Hunt is a 70 year old right-handed woman with an underlying medical history of hypertension, diabetes, COPD, arthritis, depression, pacemaker placement, chronic back pain of over 17 years duration, and smoking, who reports a long-standing history of numbness and tingling of both feet, left more than right. She has been diabetic for at least 6 years per daughter. She lives with her husband and her 32 year old grandson. She has fallen occasionally, thankfully no recent major injuries. She has a history of left femur fracture in 2000, with hardware placed which was then removed in 2001. Her last A1c is around 7 she reports. She has some numbness and tingling in her hands as well. She has had low back pain for years, she had injections into her lower back. She also reports other joint pains including both knees, she had seen orthopedics at [facility] years ago and also received injections into both knees in the past. She is currently on Percocet 5 mg strength 1-1/2 pills 3 times a day per primary care physician. She feels that this is not enough pain control. She also takes gabapentin, 600 mg in the morning and afternoon, 900 mg at night. She does report difficulty with her balance. She tries to drink enough water, per daughter she drinks a lot of water but also a lot of coffee. She smokes and continues to smoke. Of note, she has a family history of heart disease and stroke. I reviewed your office note from 03/26/2016, which you kindly included. She was not felt to have any weakness, reflexes were 1+,  lumbar spine x-rays showed no evidence of subluxation. She had a lumbar spine MRI on 03/02/2016 which I reviewed: Largely unchanged MRI appearance of the lumbar spine since 2012. Overall spinal degeneration is mild for age and there is no spinal or lateral recess stenosis. Request small left side disc herniations at T12-L1 and L4-L5 since the prior study. No new or increased neural impingement identified to explain increasing symptoms.  Her Past Medical History Is Significant For: Past Medical History:  Diagnosis Date  . Arthritis    degenerative bone disease  . COPD (chronic obstructive pulmonary disease) ([place])   . Depression   . Diabetes mellitus without complication ([place])   . GERD (gastroesophageal reflux disease)   . Hypertension   . Presence of permanent cardiac pacemaker   . Shortness of breath dyspnea     Her Past Surgical History Is Significant For: Past Surgical History:  Procedure Laterality Date  . bilateral knee surg repair 2007  2007   left femur fracture repair also  . CHOLECYSTECTOMY    . EYE SURGERY  2008   bilateral cataract removal    Her Family History Is Significant For: Family History  Problem Relation Age of Onset  . CAD Mother   . Heart attack Mother   . Heart attack Father     Her Social History Is Significant For: Social History   Social History  . Marital status: Married    Spouse name: N/A  . Number of children: N/A  . Years  of education: N/A   Occupational History  . Retired     Social History Main Topics  . Smoking status: Current Every Day Smoker    Packs/day: 1.00  . Smokeless tobacco: Not on file  . Alcohol use No     Comment: rare  . Drug use: No  . Sexual activity: Not on file   Other Topics Concern  . Not on file   Social History Narrative   Drinks about 3-4 cups of coffee a day     Her Allergies Are:  Allergies  Allergen Reactions  . Ace Inhibitors Cough  . Nsaids     Leg edema  :   Her Current Medications Are:    Outpatient Encounter Prescriptions as of 06/29/2016  Medication Sig  . aspirin EC 81 MG EC tablet Take 1 tablet (81 mg total) by mouth daily.  . budesonide-formoterol (SYMBICORT) 80-4.5 MCG/ACT inhaler Inhale 2 puffs into the lungs 2 (two) times daily.  . citalopram (CELEXA) 20 MG tablet Take 20 mg by mouth daily.  . fexofenadine (ALLEGRA) 60 MG tablet Take 60 mg by mouth 2 (two) times daily.  Marland Kitchen gabapentin (NEURONTIN) 300 MG capsule Take 600-900 mg by mouth 3 (three) times daily. 600 IN THE AM AND AFTERNOON AND 900 MG AT BEDTIME  . hydrOXYzine (ATARAX/VISTARIL) 25 MG tablet Take 25 mg by mouth 4 (four) times daily.  . insulin glargine (LANTUS) 100 UNIT/ML injection Inject 0.35 mLs (35 Units total) into the skin daily at 10 pm.  . levalbuterol (XOPENEX HFA) 45 MCG/ACT inhaler Inhale 2 puffs into the lungs every 6 (six) hours as needed for wheezing.  Marland Kitchen LORazepam (ATIVAN) 1 MG tablet Take 0.5 tablets (0.5 mg total) by mouth every 8 (eight) hours as needed for anxiety or sleep. Anxiety or sleep  . metoprolol succinate (TOPROL XL) 25 MG 24 hr tablet Take 1 tablet (25 mg total) by mouth daily.  . mirtazapine (REMERON) 30 MG tablet Take 30 mg by mouth at bedtime.  . Multiple Vitamins-Minerals (ZINC PO) Take by mouth.  . nicotine (NICODERM CQ - DOSED IN MG/24 HOURS) 21 mg/24hr patch Place 1 patch (21 mg total) onto the skin daily.  Marland Kitchen omeprazole (PRILOSEC) 20 MG capsule Take 20 mg by mouth 2 (two) times daily.  Marland Kitchen oxyCODONE-acetaminophen (PERCOCET/ROXICET) 5-325 MG tablet Take 1.5 tablets by mouth 3 (three) times daily.  . potassium citrate (UROCIT-K) 10 MEQ (1080 MG) SR tablet Take 10 mEq by mouth 2 (two) times daily.  . ranitidine (ZANTAC) 150 MG tablet Take 300 mg by mouth daily after supper.  . simvastatin (ZOCOR) 40 MG tablet Take 40 mg by mouth at bedtime.  Marland Kitchen tiZANidine (ZANAFLEX) 4 MG capsule Take 2 mg by mouth 3 (three) times daily.  Marland Kitchen triamterene-hydrochlorothiazide (MAXZIDE-25) 37.5-25 MG per  tablet Take 1 tablet by mouth daily.   No facility-administered encounter medications on file as of 06/29/2016.   :   Review of Systems:  Out of a complete 14 point review of systems, all are reviewed and negative with the exception of these symptoms as listed below:  Review of Systems  Neurological:       Patient c/o numbness in both legs. H/O surgery on L Femur and has had numbness around surgery site since surgery. H/O back pain.      Objective:  Neurologic Exam  Physical Exam Physical Examination:   There were no vitals filed for this visit. General Examination: The patient is a very pleasant 70 y.o. female  in no acute distress. She appears well-developed and well-nourished and well groomed.   HEENT: Normocephalic, atraumatic, pupils are equal, round and reactive to light and accommodation. Funduscopic exam is normal with sharp disc margins noted. Extraocular tracking is good without limitation to gaze excursion or nystagmus noted. Normal smooth pursuit is noted. Hearing is impaired. Face is symmetric with normal facial animation and normal facial sensation. Speech is clear with no dysarthria noted. There is no hypophonia. There is no lip, neck/head, jaw or voice tremor. Neck is supple with full range of passive and active motion. There are no carotid bruits on auscultation. Oropharynx exam reveals: moderate mouth dryness, adequate dental hygiene. Tongue protrudes centrally and palate elevates symmetrically.  Chest: Clear to auscultation without wheezing, rhonchi or crackles noted.  Heart: S1+S2+0, regular and normal without murmurs, rubs or gallops noted.   Abdomen: Soft, non-tender and non-distended with normal bowel sounds appreciated on auscultation.  Extremities: There is no pitting edema in the distal lower extremities bilaterally. Pedal pulses are intact.  Skin: Warm and dry without trophic changes noted. There are no varicose veins.  Musculoskeletal: exam reveals: She  reports low back pain, at times radiating to the left, she reports bilateral knee pain, denies hip pain. She denies an area of pain and numbness around the scar where she had femur surgery on the L.   Neurologically:  Mental status: The patient is awake, alert and oriented in all 4 spheres. Her immediate and remote memory, attention, language skills and fund of knowledge are appropriate. There is no evidence of aphasia, agnosia, apraxia or anomia. Speech is clear with normal prosody and enunciation. Thought process is linear. Mood is normal and affect is normal.  Cranial nerves II - XII are as described above under HEENT exam. In addition: shoulder shrug is normal with equal shoulder height noted. Motor exam: Normal bulk, strength and tone is noted. There is no drift, tremor or rebound. Romberg is negative. Reflexes are 1+ throughout. Babinski: Toes are flexor bilaterally. Fine motor skills and coordination: intact with normal finger taps, normal hand movements, normal rapid alternating patting, normal foot taps and normal foot agility.  Cerebellar testing: No dysmetria or intention tremor on finger to nose testing. Heel to shin is unremarkable bilaterally. There is no truncal or gait ataxia.  Sensory exam: intact to light touch, pinprick, vibration, temperature sense in the upper extremities, decrease to all modalities in the distal lower extremities, to above ankles bilaterally, she feels that symptoms are worse on the left.  Gait, station and balance: She stands with difficulty. No veering to one side is noted. No leaning to one side is noted. Posture is age-appropriate and stance is narrow based. Gait shows cautious gait. She has no significant limp. She turns slowly. Tandem walk is difficult for her.  Assessment and Plan:   In summary, Maureen Hunt is a very pleasant 70 y.o.-year old female with an underlying medical history of hypertension, diabetes, COPD, arthritis, depression, pacemaker  placement, chronic back pain of over 17 years duration, and smoking, who reports a long-standing history of numbness and tingling of both feet, left more than right. Her history and physical exam are in keeping with neuropathy, most likely diabetic polyneuropathy. Her back pain is managed by her primary care physician. She had further testing under your care. We will proceed with an EMG and nerve conduction test to further delineate her neuropathy. She is already on gabapentin. I would not recommend increasing this as she is already  reporting balance problems, in addition, narcotic pain medications can cause balance problems and daytime sleepiness. She is advised to stay well-hydrated and use at least her cane, have not her walker for gait safety. I will see her back routinely in a couple months, we will keep her posted as to her EMG and nerve conduction test results via phone call. I answered all their questions today and the patient and her daughter were in agreement.  Thank you very much for allowing me to participate in the care of this nice patient. If I can be of any further assistance to you please do not hesitate to call me at [phone].  Sincerely,   Star Age, MD, PhD

## 2016-07-03 ENCOUNTER — Telehealth: Payer: Self-pay | Admitting: Neurology

## 2016-07-03 NOTE — Telephone Encounter (Signed)
Please call patient and advise her that her nerve conduction tests and EMG were negative for any significant pinched nerve for polyneuropathy/nerve damage. Dr. Krista Blue who did the testing felt that she had some changes that could come from the upper spine, including her neck. If she has issues with neck pain, I would encourage her to talk to Dr. Joya Salm about this and she may need a neck MRI. If he feels that this is clinically indicated she can certainly pursue evaluation for neck degenerative disease. I do see that she had an MRI c spine years ago in September 2004 which showed some degenerative changes at the time, which certainly with time may have become worse.

## 2016-07-04 NOTE — Telephone Encounter (Signed)
LM for patient to call back.

## 2016-07-04 NOTE — Telephone Encounter (Signed)
Patient is returning your call.  

## 2016-07-04 NOTE — Telephone Encounter (Signed)
LM for patient to call back for results

## 2016-07-12 NOTE — Telephone Encounter (Signed)
LM with results and recommendations (per DPR since I could not reach patient).

## 2017-01-30 ENCOUNTER — Ambulatory Visit: Payer: Medicare Other | Admitting: "Endocrinology

## 2018-02-13 ENCOUNTER — Ambulatory Visit: Payer: Medicare Other | Admitting: Pediatrics

## 2018-05-26 DIAGNOSIS — Z1231 Encounter for screening mammogram for malignant neoplasm of breast: Secondary | ICD-10-CM | POA: Diagnosis not present

## 2018-05-26 DIAGNOSIS — G894 Chronic pain syndrome: Secondary | ICD-10-CM | POA: Diagnosis not present

## 2018-05-26 DIAGNOSIS — Z136 Encounter for screening for cardiovascular disorders: Secondary | ICD-10-CM | POA: Diagnosis not present

## 2018-05-26 DIAGNOSIS — Z13220 Encounter for screening for lipoid disorders: Secondary | ICD-10-CM | POA: Diagnosis not present

## 2018-05-26 DIAGNOSIS — Z23 Encounter for immunization: Secondary | ICD-10-CM | POA: Diagnosis not present

## 2018-05-26 DIAGNOSIS — Z0000 Encounter for general adult medical examination without abnormal findings: Secondary | ICD-10-CM | POA: Diagnosis not present

## 2018-05-26 DIAGNOSIS — Z5181 Encounter for therapeutic drug level monitoring: Secondary | ICD-10-CM | POA: Diagnosis not present

## 2018-05-26 DIAGNOSIS — M25561 Pain in right knee: Secondary | ICD-10-CM | POA: Diagnosis not present

## 2018-05-26 DIAGNOSIS — I10 Essential (primary) hypertension: Secondary | ICD-10-CM | POA: Diagnosis not present

## 2018-05-26 DIAGNOSIS — I700 Atherosclerosis of aorta: Secondary | ICD-10-CM | POA: Diagnosis not present

## 2018-05-26 DIAGNOSIS — J449 Chronic obstructive pulmonary disease, unspecified: Secondary | ICD-10-CM | POA: Diagnosis not present

## 2018-05-26 DIAGNOSIS — E1149 Type 2 diabetes mellitus with other diabetic neurological complication: Secondary | ICD-10-CM | POA: Diagnosis not present

## 2018-06-19 DIAGNOSIS — R928 Other abnormal and inconclusive findings on diagnostic imaging of breast: Secondary | ICD-10-CM | POA: Diagnosis not present

## 2018-06-19 DIAGNOSIS — Z1231 Encounter for screening mammogram for malignant neoplasm of breast: Secondary | ICD-10-CM | POA: Diagnosis not present

## 2018-06-26 DIAGNOSIS — T402X5A Adverse effect of other opioids, initial encounter: Secondary | ICD-10-CM | POA: Diagnosis not present

## 2018-06-26 DIAGNOSIS — Z794 Long term (current) use of insulin: Secondary | ICD-10-CM | POA: Diagnosis not present

## 2018-06-26 DIAGNOSIS — E119 Type 2 diabetes mellitus without complications: Secondary | ICD-10-CM | POA: Diagnosis not present

## 2018-06-26 DIAGNOSIS — D3132 Benign neoplasm of left choroid: Secondary | ICD-10-CM | POA: Diagnosis not present

## 2018-06-26 DIAGNOSIS — F322 Major depressive disorder, single episode, severe without psychotic features: Secondary | ICD-10-CM | POA: Diagnosis not present

## 2018-06-26 DIAGNOSIS — K5903 Drug induced constipation: Secondary | ICD-10-CM | POA: Diagnosis not present

## 2018-07-07 DIAGNOSIS — M170 Bilateral primary osteoarthritis of knee: Secondary | ICD-10-CM | POA: Diagnosis not present

## 2018-07-07 DIAGNOSIS — M160 Bilateral primary osteoarthritis of hip: Secondary | ICD-10-CM | POA: Diagnosis not present

## 2018-07-07 DIAGNOSIS — M7062 Trochanteric bursitis, left hip: Secondary | ICD-10-CM | POA: Diagnosis not present

## 2018-07-30 DIAGNOSIS — R928 Other abnormal and inconclusive findings on diagnostic imaging of breast: Secondary | ICD-10-CM | POA: Diagnosis not present

## 2018-07-30 DIAGNOSIS — N6320 Unspecified lump in the left breast, unspecified quadrant: Secondary | ICD-10-CM | POA: Diagnosis not present

## 2018-09-10 DIAGNOSIS — F322 Major depressive disorder, single episode, severe without psychotic features: Secondary | ICD-10-CM | POA: Diagnosis not present

## 2018-09-10 DIAGNOSIS — M170 Bilateral primary osteoarthritis of knee: Secondary | ICD-10-CM | POA: Diagnosis not present

## 2018-09-10 DIAGNOSIS — M7062 Trochanteric bursitis, left hip: Secondary | ICD-10-CM | POA: Diagnosis not present

## 2018-09-10 DIAGNOSIS — Z23 Encounter for immunization: Secondary | ICD-10-CM | POA: Diagnosis not present

## 2018-09-10 DIAGNOSIS — Z130 Encounter for screening for diseases of the blood and blood-forming organs and certain disorders involving the immune mechanism: Secondary | ICD-10-CM | POA: Diagnosis not present

## 2018-09-10 DIAGNOSIS — G4700 Insomnia, unspecified: Secondary | ICD-10-CM | POA: Diagnosis not present

## 2018-10-08 DIAGNOSIS — M1711 Unilateral primary osteoarthritis, right knee: Secondary | ICD-10-CM | POA: Diagnosis not present

## 2018-10-08 DIAGNOSIS — M7062 Trochanteric bursitis, left hip: Secondary | ICD-10-CM | POA: Diagnosis not present

## 2019-02-20 DEATH — deceased
